# Patient Record
Sex: Male | Born: 1935 | Race: White | Hispanic: No | Smoking: Former smoker
Health system: Southern US, Community
[De-identification: ages and names within clinical notes are randomized; demographics above are authoritative.]

## PROBLEM LIST (undated history)

## (undated) DIAGNOSIS — D291 Benign neoplasm of prostate: Secondary | ICD-10-CM

## (undated) DIAGNOSIS — R451 Restlessness and agitation: Secondary | ICD-10-CM

## (undated) DIAGNOSIS — I10 Essential (primary) hypertension: Secondary | ICD-10-CM

## (undated) HISTORY — PX: CATARACT EXTRACTION: SUR2

---

## 2004-12-20 ENCOUNTER — Ambulatory Visit: Payer: Self-pay

## 2005-01-07 ENCOUNTER — Ambulatory Visit: Payer: Self-pay | Admitting: Anesthesiology

## 2005-01-11 ENCOUNTER — Ambulatory Visit: Payer: Self-pay | Admitting: Anesthesiology

## 2005-01-24 ENCOUNTER — Ambulatory Visit: Payer: Self-pay | Admitting: Anesthesiology

## 2005-02-07 ENCOUNTER — Ambulatory Visit: Payer: Self-pay | Admitting: Anesthesiology

## 2005-03-02 ENCOUNTER — Ambulatory Visit: Payer: Self-pay | Admitting: Unknown Physician Specialty

## 2005-04-06 ENCOUNTER — Ambulatory Visit: Payer: Self-pay | Admitting: Anesthesiology

## 2005-05-05 ENCOUNTER — Ambulatory Visit: Payer: Self-pay | Admitting: Anesthesiology

## 2005-07-13 ENCOUNTER — Ambulatory Visit: Payer: Self-pay | Admitting: Anesthesiology

## 2005-09-21 ENCOUNTER — Ambulatory Visit: Payer: Self-pay | Admitting: Anesthesiology

## 2005-10-24 ENCOUNTER — Ambulatory Visit: Payer: Self-pay | Admitting: Anesthesiology

## 2005-12-13 ENCOUNTER — Ambulatory Visit: Payer: Self-pay | Admitting: Anesthesiology

## 2006-01-18 ENCOUNTER — Ambulatory Visit: Payer: Self-pay | Admitting: Anesthesiology

## 2006-03-21 ENCOUNTER — Ambulatory Visit: Payer: Self-pay | Admitting: Anesthesiology

## 2006-05-17 ENCOUNTER — Ambulatory Visit: Payer: Self-pay | Admitting: Anesthesiology

## 2006-07-10 ENCOUNTER — Ambulatory Visit: Payer: Self-pay | Admitting: Anesthesiology

## 2006-09-05 ENCOUNTER — Ambulatory Visit: Payer: Self-pay | Admitting: Anesthesiology

## 2006-12-11 ENCOUNTER — Ambulatory Visit: Payer: Self-pay | Admitting: Anesthesiology

## 2007-02-06 ENCOUNTER — Ambulatory Visit: Payer: Self-pay | Admitting: Anesthesiology

## 2007-04-18 ENCOUNTER — Ambulatory Visit: Payer: Self-pay | Admitting: Anesthesiology

## 2007-06-18 ENCOUNTER — Ambulatory Visit: Payer: Self-pay | Admitting: Anesthesiology

## 2007-08-22 ENCOUNTER — Ambulatory Visit: Payer: Self-pay | Admitting: Anesthesiology

## 2007-10-11 ENCOUNTER — Ambulatory Visit: Payer: Self-pay | Admitting: Anesthesiology

## 2007-12-11 ENCOUNTER — Ambulatory Visit: Payer: Self-pay | Admitting: Anesthesiology

## 2008-01-17 ENCOUNTER — Ambulatory Visit: Payer: Self-pay | Admitting: Anesthesiology

## 2008-03-19 ENCOUNTER — Ambulatory Visit: Payer: Self-pay | Admitting: Anesthesiology

## 2008-05-13 ENCOUNTER — Ambulatory Visit: Payer: Self-pay | Admitting: Anesthesiology

## 2008-07-09 ENCOUNTER — Ambulatory Visit: Payer: Self-pay | Admitting: Anesthesiology

## 2008-09-18 ENCOUNTER — Ambulatory Visit: Payer: Self-pay | Admitting: Anesthesiology

## 2008-11-11 ENCOUNTER — Ambulatory Visit: Payer: Self-pay | Admitting: Anesthesiology

## 2009-01-07 ENCOUNTER — Ambulatory Visit: Payer: Self-pay | Admitting: Anesthesiology

## 2009-03-26 ENCOUNTER — Ambulatory Visit: Payer: Self-pay | Admitting: Anesthesiology

## 2009-05-14 ENCOUNTER — Ambulatory Visit: Payer: Self-pay | Admitting: Anesthesiology

## 2009-07-15 ENCOUNTER — Ambulatory Visit: Payer: Self-pay | Admitting: Anesthesiology

## 2009-09-09 ENCOUNTER — Ambulatory Visit: Payer: Self-pay | Admitting: Anesthesiology

## 2009-09-22 ENCOUNTER — Ambulatory Visit: Payer: Self-pay | Admitting: Anesthesiology

## 2009-11-12 ENCOUNTER — Ambulatory Visit: Payer: Self-pay | Admitting: Anesthesiology

## 2010-04-20 ENCOUNTER — Ambulatory Visit: Payer: Self-pay | Admitting: Anesthesiology

## 2010-07-06 ENCOUNTER — Ambulatory Visit: Payer: Self-pay | Admitting: Anesthesiology

## 2010-09-07 ENCOUNTER — Ambulatory Visit: Payer: Self-pay | Admitting: Anesthesiology

## 2010-12-15 ENCOUNTER — Ambulatory Visit: Payer: Self-pay | Admitting: Anesthesiology

## 2011-02-10 ENCOUNTER — Ambulatory Visit: Payer: Self-pay | Admitting: Anesthesiology

## 2011-03-16 ENCOUNTER — Ambulatory Visit: Payer: Self-pay | Admitting: Anesthesiology

## 2011-05-04 ENCOUNTER — Ambulatory Visit: Payer: Self-pay | Admitting: Pain Medicine

## 2011-05-05 ENCOUNTER — Ambulatory Visit: Payer: Self-pay | Admitting: Pain Medicine

## 2011-05-06 ENCOUNTER — Ambulatory Visit: Payer: Self-pay | Admitting: Pain Medicine

## 2011-05-10 ENCOUNTER — Ambulatory Visit: Payer: Self-pay | Admitting: Pain Medicine

## 2011-05-16 ENCOUNTER — Ambulatory Visit: Payer: Self-pay | Admitting: Pain Medicine

## 2011-05-19 ENCOUNTER — Ambulatory Visit: Payer: Self-pay | Admitting: Pain Medicine

## 2011-06-06 ENCOUNTER — Ambulatory Visit: Payer: Self-pay | Admitting: Pain Medicine

## 2011-08-11 ENCOUNTER — Ambulatory Visit: Payer: Self-pay | Admitting: Pain Medicine

## 2011-10-27 ENCOUNTER — Ambulatory Visit: Payer: Self-pay | Admitting: Pain Medicine

## 2011-11-09 ENCOUNTER — Ambulatory Visit: Payer: Self-pay | Admitting: Pain Medicine

## 2015-02-05 DIAGNOSIS — I10 Essential (primary) hypertension: Secondary | ICD-10-CM | POA: Diagnosis not present

## 2015-02-05 DIAGNOSIS — E038 Other specified hypothyroidism: Secondary | ICD-10-CM | POA: Diagnosis not present

## 2015-02-05 DIAGNOSIS — M1990 Unspecified osteoarthritis, unspecified site: Secondary | ICD-10-CM | POA: Diagnosis not present

## 2015-02-05 DIAGNOSIS — D510 Vitamin B12 deficiency anemia due to intrinsic factor deficiency: Secondary | ICD-10-CM | POA: Diagnosis not present

## 2015-07-03 DIAGNOSIS — H6122 Impacted cerumen, left ear: Secondary | ICD-10-CM | POA: Diagnosis not present

## 2015-08-10 DIAGNOSIS — D510 Vitamin B12 deficiency anemia due to intrinsic factor deficiency: Secondary | ICD-10-CM | POA: Diagnosis not present

## 2015-08-10 DIAGNOSIS — E038 Other specified hypothyroidism: Secondary | ICD-10-CM | POA: Diagnosis not present

## 2015-08-10 DIAGNOSIS — I10 Essential (primary) hypertension: Secondary | ICD-10-CM | POA: Diagnosis not present

## 2015-08-18 DIAGNOSIS — D510 Vitamin B12 deficiency anemia due to intrinsic factor deficiency: Secondary | ICD-10-CM | POA: Diagnosis not present

## 2015-08-18 DIAGNOSIS — N401 Enlarged prostate with lower urinary tract symptoms: Secondary | ICD-10-CM | POA: Diagnosis not present

## 2015-08-18 DIAGNOSIS — M1990 Unspecified osteoarthritis, unspecified site: Secondary | ICD-10-CM | POA: Diagnosis not present

## 2015-08-18 DIAGNOSIS — E039 Hypothyroidism, unspecified: Secondary | ICD-10-CM | POA: Diagnosis not present

## 2015-08-18 DIAGNOSIS — Z23 Encounter for immunization: Secondary | ICD-10-CM | POA: Diagnosis not present

## 2015-08-18 DIAGNOSIS — I10 Essential (primary) hypertension: Secondary | ICD-10-CM | POA: Diagnosis not present

## 2016-01-13 DIAGNOSIS — R509 Fever, unspecified: Secondary | ICD-10-CM | POA: Diagnosis not present

## 2016-04-14 ENCOUNTER — Emergency Department: Payer: Commercial Managed Care - HMO

## 2016-04-14 ENCOUNTER — Encounter: Payer: Self-pay | Admitting: Internal Medicine

## 2016-04-14 ENCOUNTER — Inpatient Hospital Stay
Admission: EM | Admit: 2016-04-14 | Discharge: 2016-04-18 | DRG: 085 | Disposition: A | Discharge status: Skilled Nursing Facility | Payer: Commercial Managed Care - HMO | Attending: Internal Medicine | Admitting: Internal Medicine

## 2016-04-14 DIAGNOSIS — Z66 Do not resuscitate: Secondary | ICD-10-CM | POA: Diagnosis present

## 2016-04-14 DIAGNOSIS — M6281 Muscle weakness (generalized): Secondary | ICD-10-CM | POA: Diagnosis not present

## 2016-04-14 DIAGNOSIS — M6282 Rhabdomyolysis: Secondary | ICD-10-CM | POA: Diagnosis present

## 2016-04-14 DIAGNOSIS — S065X0A Traumatic subdural hemorrhage without loss of consciousness, initial encounter: Principal | ICD-10-CM | POA: Diagnosis present

## 2016-04-14 DIAGNOSIS — S199XXA Unspecified injury of neck, initial encounter: Secondary | ICD-10-CM | POA: Diagnosis not present

## 2016-04-14 DIAGNOSIS — E876 Hypokalemia: Secondary | ICD-10-CM | POA: Diagnosis present

## 2016-04-14 DIAGNOSIS — W19XXXA Unspecified fall, initial encounter: Secondary | ICD-10-CM

## 2016-04-14 DIAGNOSIS — R339 Retention of urine, unspecified: Secondary | ICD-10-CM | POA: Diagnosis not present

## 2016-04-14 DIAGNOSIS — Z9181 History of falling: Secondary | ICD-10-CM

## 2016-04-14 DIAGNOSIS — I10 Essential (primary) hypertension: Secondary | ICD-10-CM | POA: Diagnosis not present

## 2016-04-14 DIAGNOSIS — S065X9A Traumatic subdural hemorrhage with loss of consciousness of unspecified duration, initial encounter: Secondary | ICD-10-CM

## 2016-04-14 DIAGNOSIS — R6889 Other general symptoms and signs: Secondary | ICD-10-CM | POA: Diagnosis not present

## 2016-04-14 DIAGNOSIS — Z8249 Family history of ischemic heart disease and other diseases of the circulatory system: Secondary | ICD-10-CM

## 2016-04-14 DIAGNOSIS — E43 Unspecified severe protein-calorie malnutrition: Secondary | ICD-10-CM | POA: Diagnosis not present

## 2016-04-14 DIAGNOSIS — F0390 Unspecified dementia without behavioral disturbance: Secondary | ICD-10-CM | POA: Diagnosis present

## 2016-04-14 DIAGNOSIS — Z7401 Bed confinement status: Secondary | ICD-10-CM | POA: Diagnosis not present

## 2016-04-14 DIAGNOSIS — Z6823 Body mass index (BMI) 23.0-23.9, adult: Secondary | ICD-10-CM | POA: Diagnosis not present

## 2016-04-14 DIAGNOSIS — Y92003 Bedroom of unspecified non-institutional (private) residence as the place of occurrence of the external cause: Secondary | ICD-10-CM

## 2016-04-14 DIAGNOSIS — S065XAA Traumatic subdural hemorrhage with loss of consciousness status unknown, initial encounter: Secondary | ICD-10-CM | POA: Diagnosis present

## 2016-04-14 DIAGNOSIS — R911 Solitary pulmonary nodule: Secondary | ICD-10-CM | POA: Diagnosis present

## 2016-04-14 DIAGNOSIS — R9431 Abnormal electrocardiogram [ECG] [EKG]: Secondary | ICD-10-CM | POA: Diagnosis present

## 2016-04-14 DIAGNOSIS — I6200 Nontraumatic subdural hemorrhage, unspecified: Secondary | ICD-10-CM | POA: Diagnosis not present

## 2016-04-14 DIAGNOSIS — N189 Chronic kidney disease, unspecified: Secondary | ICD-10-CM | POA: Diagnosis present

## 2016-04-14 DIAGNOSIS — W06XXXA Fall from bed, initial encounter: Secondary | ICD-10-CM | POA: Diagnosis present

## 2016-04-14 DIAGNOSIS — I129 Hypertensive chronic kidney disease with stage 1 through stage 4 chronic kidney disease, or unspecified chronic kidney disease: Secondary | ICD-10-CM | POA: Diagnosis not present

## 2016-04-14 DIAGNOSIS — E441 Mild protein-calorie malnutrition: Secondary | ICD-10-CM | POA: Diagnosis not present

## 2016-04-14 DIAGNOSIS — D291 Benign neoplasm of prostate: Secondary | ICD-10-CM | POA: Diagnosis not present

## 2016-04-14 DIAGNOSIS — S61412A Laceration without foreign body of left hand, initial encounter: Secondary | ICD-10-CM | POA: Diagnosis present

## 2016-04-14 DIAGNOSIS — N401 Enlarged prostate with lower urinary tract symptoms: Secondary | ICD-10-CM | POA: Diagnosis present

## 2016-04-14 DIAGNOSIS — R338 Other retention of urine: Secondary | ICD-10-CM | POA: Diagnosis present

## 2016-04-14 DIAGNOSIS — Z5189 Encounter for other specified aftercare: Secondary | ICD-10-CM | POA: Diagnosis not present

## 2016-04-14 DIAGNOSIS — R451 Restlessness and agitation: Secondary | ICD-10-CM | POA: Diagnosis present

## 2016-04-14 DIAGNOSIS — R41841 Cognitive communication deficit: Secondary | ICD-10-CM | POA: Diagnosis not present

## 2016-04-14 DIAGNOSIS — R1312 Dysphagia, oropharyngeal phase: Secondary | ICD-10-CM | POA: Diagnosis not present

## 2016-04-14 HISTORY — DX: Essential (primary) hypertension: I10

## 2016-04-14 LAB — CBC
HCT: 39.7 % — ABNORMAL LOW (ref 40.0–52.0)
Hemoglobin: 13.2 g/dL (ref 13.0–18.0)
MCH: 29.6 pg (ref 26.0–34.0)
MCHC: 33.3 g/dL (ref 32.0–36.0)
MCV: 88.9 fL (ref 80.0–100.0)
Platelets: 140 10*3/uL — ABNORMAL LOW (ref 150–440)
RBC: 4.47 MIL/uL (ref 4.40–5.90)
RDW: 15 % — AB (ref 11.5–14.5)
WBC: 11.6 10*3/uL — ABNORMAL HIGH (ref 3.8–10.6)

## 2016-04-14 LAB — BASIC METABOLIC PANEL
ANION GAP: 7 (ref 5–15)
BUN: 16 mg/dL (ref 6–20)
CALCIUM: 8.8 mg/dL — AB (ref 8.9–10.3)
CO2: 26 mmol/L (ref 22–32)
Chloride: 105 mmol/L (ref 101–111)
Creatinine, Ser: 1.3 mg/dL — ABNORMAL HIGH (ref 0.61–1.24)
GFR calc Af Amer: 52 mL/min — ABNORMAL LOW (ref >60)
GFR, EST NON AFRICAN AMERICAN: 45 mL/min — AB (ref >60)
GLUCOSE: 138 mg/dL — AB (ref 65–99)
Potassium: 3.3 mmol/L — ABNORMAL LOW (ref 3.5–5.1)
Sodium: 138 mmol/L (ref 135–145)

## 2016-04-14 LAB — CK: CK TOTAL: 992 U/L — AB (ref 49–397)

## 2016-04-14 MED ORDER — SODIUM CHLORIDE 0.9 % IV SOLN
INTRAVENOUS | Status: DC
  Administered 2016-04-14: 17:00:00 via INTRAVENOUS

## 2016-04-14 MED ORDER — ONDANSETRON HCL 4 MG/2ML IJ SOLN: 4.0000 mg | Freq: Four times a day (QID) | INTRAMUSCULAR | Status: DC | PRN

## 2016-04-14 MED ORDER — DOCUSATE SODIUM 100 MG PO CAPS
100.0000 mg | ORAL_CAPSULE | Freq: Two times a day (BID) | ORAL | Status: DC
  Administered 2016-04-14 – 2016-04-17 (×6): 100 mg via ORAL
  Filled 2016-04-14 (×6): qty 1

## 2016-04-14 MED ORDER — SODIUM CHLORIDE 0.9 % IV SOLN: INTRAVENOUS | Status: DC

## 2016-04-14 MED ORDER — MORPHINE SULFATE (PF) 2 MG/ML IV SOLN: 1.0000 mg | INTRAVENOUS | Status: DC | PRN

## 2016-04-14 MED ORDER — ONDANSETRON HCL 4 MG PO TABS: 4.0000 mg | ORAL_TABLET | Freq: Four times a day (QID) | ORAL | Status: DC | PRN

## 2016-04-14 MED ORDER — SENNOSIDES-DOCUSATE SODIUM 8.6-50 MG PO TABS
1.0000 | ORAL_TABLET | Freq: Every evening | ORAL | Status: DC | PRN
  Administered 2016-04-16: 1 via ORAL
  Filled 2016-04-14: qty 1

## 2016-04-14 MED ORDER — SODIUM CHLORIDE 0.9 % IV BOLUS (SEPSIS)
500.0000 mL | Freq: Once | INTRAVENOUS | Status: AC
  Administered 2016-04-14: 500 mL via INTRAVENOUS

## 2016-04-14 MED ORDER — ACETAMINOPHEN 325 MG PO TABS: 650.0000 mg | ORAL_TABLET | Freq: Four times a day (QID) | ORAL | Status: DC | PRN

## 2016-04-14 MED ORDER — OXYCODONE HCL 5 MG PO TABS
5.0000 mg | ORAL_TABLET | ORAL | Status: DC | PRN
  Administered 2016-04-14: 17:00:00 5 mg via ORAL
  Filled 2016-04-14: qty 1

## 2016-04-14 MED ORDER — ACETAMINOPHEN 650 MG RE SUPP: 650.0000 mg | Freq: Four times a day (QID) | RECTAL | Status: DC | PRN

## 2016-04-14 NOTE — ED Notes (Signed)
EDP at bedside  

## 2016-04-14 NOTE — ED Notes (Signed)
Attempted to call report; floor unaware as to which nurse will be taking patient at this time and will have to call me back.  Name and ascom number left with Network engineer.

## 2016-04-14 NOTE — ED Notes (Signed)
Pt here after falling out of bed at home. Daughter wants patient evaluated. Pt denies any complaints at this time, does not seem to be good historian. Pt with redness to left eye, redness to left side of chest and left arm.

## 2016-04-14 NOTE — H&P (Addendum)
[facility] [place] at [facility]   PATIENT NAME: Aaron Ellis    MR#:  [id number]  DATE OF BIRTH:  [birth year]  DATE OF ADMISSION:  04/14/2016  PRIMARY CARE PHYSICIAN: Lauro Regulus., MD   REQUESTING/REFERRING PHYSICIAN: Dr Derrill Kay  CHIEF COMPLAINT:  Came in after a fall at home last night. Patient was found half slid under the bed by his daughter  HISTORY OF PRESENT ILLNESS:  Aaron Ellis  is a 82 y.o. male with a known history of Hypertension, BPH, chronic kidney disease, early cognitive decline comes in the emergency room brought in by family after he was found down on on that ground near his bed last night. Patient does not remember the fall. He appears somewhat confused. Daughter states he is getting on and off confusion along with frequent falls at home. Patient lives at home by himself uses a cane to get around. Daughter lives next door and checks on him several times a day. -CT scan in the emergency room showed subdural hematoma. Family does not want any aggressive management. They do not want him to get transferred. Patient otherwise is neurologically intact. He is to be admitted for further evaluation management.  PAST MEDICAL HISTORY:   Past Medical History  Diagnosis Date  . Hypertension     PAST SURGICAL HISTOIRY:   Past Surgical History  Procedure Laterality Date  . Cataract extraction      SOCIAL HISTORY:   Social History  Substance Use Topics  . Smoking status: Not on file  . Smokeless tobacco: Not on file  . Alcohol Use: Not on file    FAMILY HISTORY:   Family History  Problem Relation Age of Onset  . Hypertension Other     DRUG ALLERGIES:  No Known Allergies  REVIEW OF SYSTEMS:  Review of Systems  Unable to perform ROS: dementia     MEDICATIONS AT HOME:   Prior to Admission medications   Not on File      VITAL SIGNS:  Blood pressure 144/81, pulse 95, temperature 97.8 F (36.6 C), temperature source  Oral, resp. rate 12, SpO2 96 %.  PHYSICAL EXAMINATION:  GENERAL:  82 y.o.-year-old patient lying in the bed with no acute distress.  EYES: Pupils equal, round, reactive to light and accommodation. No scleral icterus. Extraocular muscles intact.  HEENT: Head atraumatic, normocephalic. Oropharynx and nasopharynx clear.  NECK:  Supple, no jugular venous distention. No thyroid enlargement, no tenderness.  LUNGS: Normal breath sounds bilaterally, no wheezing, rales,rhonchi or crepitation. No use of accessory muscles of respiration.  CARDIOVASCULAR: S1, S2 normal. No murmurs, rubs, or gallops.  ABDOMEN: Soft, nontender, nondistended. Bowel sounds present. No organomegaly or mass.  EXTREMITIES: No pedal edema, cyanosis, or clubbing. Small skin tear over the right wrist. No bleeding. Patient has different level of ecchymosis all over his upper and lower extremities NEUROLOGIC: Cranial nerves II through XII are intact. Muscle strength 4/5 in all extremities. Sensation intact. Gait not checked. Grossly nonfocal. PSYCHIATRIC: The patient is alert and oriented x 3.  SKIN: No obvious rash, lesion, or ulcer. Ecchymosis as described above.  LABORATORY PANEL:   CBC  Recent Labs Lab 04/14/16 1046  WBC 11.6*  HGB 13.2  HCT 39.7*  PLT 140*   ------------------------------------------------------------------------------------------------------------------  Chemistries   Recent Labs Lab 04/14/16 1046  NA 138  K 3.3*  CL 105  CO2 26  GLUCOSE 138*  BUN 16  CREATININE 1.30*  CALCIUM 8.8*   ------------------------------------------------------------------------------------------------------------------  Cardiac Enzymes  No results for input(s): TROPONINI in the last 168 hours. ------------------------------------------------------------------------------------------------------------------  RADIOLOGY:  Ct Head Wo Contrast  04/14/2016  ADDENDUM REPORT: 04/14/2016 12:03 ADDENDUM: Critical  Value/emergent results were called by telephone at the time of interpretation on 04/14/2016 at 1155 hours at to Dr. Minna Antis , who verbally acknowledged these results. Electronically Signed   By: Odessa Fleming M.D.   On: 04/14/2016 12:03  04/14/2016  CLINICAL DATA:  82 year old male who fell out of bed. Left orbital redness. Initial encounter. EXAM: CT HEAD WITHOUT CONTRAST CT CERVICAL SPINE WITHOUT CONTRAST TECHNIQUE: Multidetector CT imaging of the head and cervical spine was performed following the standard protocol without intravenous contrast. Multiplanar CT image reconstructions of the cervical spine were also generated. COMPARISON:  None. FINDINGS: CT HEAD FINDINGS No scalp hematoma identified. No acute orbits soft tissue findings. Negative visualized noncontrast deep soft tissue spaces of the face. Trace fluid level in the left sphenoid sinus. Trace ethmoid mucosal thickening. Other paranasal sinuses, mastoids, and tympanic cavities are clear. Calvarium intact. Asymmetric 5-6 mm thick extra-axial collection along the left hemisphere is low-density. The collection is slightly mixed density on series 5, image 17. The density is higher than CSF, although there is a mild volume of extra-axial CSF about both hemispheres. Trace rightward midline shift. No other intracranial hemorrhage identified. No ventriculomegaly. Patchy nonspecific cerebral white matter hypodensity. No definite cortical encephalomalacia. No cortically based acute infarct identified. CT CERVICAL SPINE FINDINGS Diffuse, bulky and bridging ventral vertebral endplate osteophytes from the C2 level to the upper thoracic spine (visible to T2-T3). There is also some bridging ossification of the posterior longitudinal ligament in the mid cervical spine. There is associated posterior element ankylosis at C2-C3 greater on the right. Bilateral posterior element alignment is maintained. Cervicothoracic junction alignment is within normal limits. No  spondylolisthesis identified. Visualized skull base is intact. No atlanto-occipital dissociation. Ligamentous hypertrophy about the odontoid. Occipital condyles -C1 mild to moderate degenerative changes. No cervical or upper thoracic vertebral fracture is identified. No epidural hemorrhage identified. Negative noncontrast paraspinal soft tissues. In the lung apices a solitary 5 mm lung nodule is noted on series 4 image 98 and this may be slightly calcified. IMPRESSION: 1. Positive for mild left subdural hematoma measuring 5-6 mm in thickness. Trace rightward midline shift. 2. No other No acute intracranial abnormality. 3. 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 No acute fracture or listhesis identified in the cervical spine. Ligamentous injury is not excluded. 4. Right upper lobe solitary 5 mm lung nodule. No follow-up needed if patient is low-risk. Non-contrast chest CT can be considered in 12 months if patient is high-risk. This recommendation follows the consensus statement: Guidelines for Management of Incidental Pulmonary Nodules Detected on CT Images:From the Fleischner Society 2017; published online before print (10.1148/[id number]). Electronically Signed: By: Odessa Fleming M.D. On: 04/14/2016 11:50   Ct Cervical Spine Wo Contrast  04/14/2016  ADDENDUM REPORT: 04/14/2016 12:03 ADDENDUM: Critical Value/emergent results were called by telephone at the time of interpretation on 04/14/2016 at 1155 hours at to Dr. Minna Antis , who verbally acknowledged these results. Electronically Signed   By: Odessa Fleming M.D.   On: 04/14/2016 12:03  04/14/2016  CLINICAL DATA:  82 year old male who fell out of bed. Left orbital redness. Initial encounter. EXAM: CT HEAD WITHOUT CONTRAST CT CERVICAL SPINE WITHOUT CONTRAST TECHNIQUE: Multidetector CT imaging of the head and cervical spine was performed following the standard protocol without intravenous contrast. Multiplanar CT image reconstructions  of the  cervical spine were also generated. COMPARISON:  None. FINDINGS: CT HEAD FINDINGS No scalp hematoma identified. No acute orbits soft tissue findings. Negative visualized noncontrast deep soft tissue spaces of the face. Trace fluid level in the left sphenoid sinus. Trace ethmoid mucosal thickening. Other paranasal sinuses, mastoids, and tympanic cavities are clear. Calvarium intact. Asymmetric 5-6 mm thick extra-axial collection along the left hemisphere is low-density. The collection is slightly mixed density on series 5, image 17. The density is higher than CSF, although there is a mild volume of extra-axial CSF about both hemispheres. Trace rightward midline shift. No other intracranial hemorrhage identified. No ventriculomegaly. Patchy nonspecific cerebral white matter hypodensity. No definite cortical encephalomalacia. No cortically based acute infarct identified. CT CERVICAL SPINE FINDINGS Diffuse, bulky and bridging ventral vertebral endplate osteophytes from the C2 level to the upper thoracic spine (visible to T2-T3). There is also some bridging ossification of the posterior longitudinal ligament in the mid cervical spine. There is associated posterior element ankylosis at C2-C3 greater on the right. Bilateral posterior element alignment is maintained. Cervicothoracic junction alignment is within normal limits. No spondylolisthesis identified. Visualized skull base is intact. No atlanto-occipital dissociation. Ligamentous hypertrophy about the odontoid. Occipital condyles -C1 mild to moderate degenerative changes. No cervical or upper thoracic vertebral fracture is identified. No epidural hemorrhage identified. Negative noncontrast paraspinal soft tissues. In the lung apices a solitary 5 mm lung nodule is noted on series 4 image 98 and this may be slightly calcified. IMPRESSION: 1. Positive for mild left subdural hematoma measuring 5-6 mm in thickness. Trace rightward midline shift. 2. No other No  acute intracranial abnormality. 3. 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 No acute fracture or listhesis identified in the cervical spine. Ligamentous injury is not excluded. 4. Right upper lobe solitary 5 mm lung nodule. No follow-up needed if patient is low-risk. Non-contrast chest CT can be considered in 12 months if patient is high-risk. This recommendation follows the consensus statement: Guidelines for Management of Incidental Pulmonary Nodules Detected on CT Images:From the Fleischner Society 2017; published online before print (10.1148/[id number]). Electronically Signed: By: Odessa Fleming M.D. On: 04/14/2016 11:50    EKG:    IMPRESSION AND PLAN:  Aaron Ellis  is a 82 y.o. male with a known history of Hypertension, BPH, chronic kidney disease, early cognitive decline comes in the emergency room brought in by family after he was found down on on that ground near his bed last night. Patient does not remember the fall.  1. Acute left subdural hematoma CT head shows Positive for mild left subdural hematoma measuring 5-6 mm in thickness. Trace rightward midline shift. Avoid blood thinners neurochecks q 4 hourly Repeat CT head in the morning. Neurology consult if needed. Patient's family does not want anything aggressive done. They do not want him to be transferred to tertiary care center in the event it is needed. CODE STATUS discussed with patient's daughter Dois Davenport patient is DO NOT RESUSCITATE.  2.HTN Resume home meds  3. Early cognitive decline/dementia/falls at home Pt lives alone, uses cane, having falls at home SW for d/c planning PT to see pt  4. DVT prophylaxis SCD/TEDs  5. Mild rhabdomyolysis IVF for now  Spoke with dter and brother  All the records are reviewed and case discussed with ED provider. Management plans discussed with the patient, family and they are in agreement.  CODE STATUS: DNR  TOTAL TIME TAKING CARE OF THIS PATIENT: 50  minutes.    Danella Philson M.D on 04/14/2016  at 12:50 PM  Between 7am to 6pm - Pager - [phone]  After 6pm go to [email or web address] - password [email or web address] [facility]  [place] [facility]  Office  [phone]  CC: Primary care physician; Lauro Regulus., MD

## 2016-04-14 NOTE — ED Provider Notes (Signed)
[facility] Emergency Department Provider Note   ____________________________________________  Time seen: ~1140  I have reviewed the triage vital signs and the nursing notes.   HISTORY  Chief Complaint Fall   History limited by: Not Limited, some history obtained from daughter   HPI Aaron Ellis is a 82 y.o. male who presents to the emergency department from home today. Daughter states that when she got to his house this morning he was lying slightly underneath his bed. Patient is unclear of how he fell or came to be under the bed.He did suffer a small skin tear to his left hand. He also has some swelling around his left eye. The patient denies any pain in his head. He denies any pain in his neck. Denies any pain in his upper or lower extremities. Denies any recent fevers. Daughter states that he has had a slow decline over the past couple of months. He is currently living by himself. They have discussed with the patient possibility of going to a nursing facility elevated patient has been hesitant. They have not discussed this with primary care.   Past Medical History  Diagnosis Date  . Hypertension     Past Surgical History  Procedure Laterality Date  . Cataract extraction      Current Outpatient Rx  Name  Route  Sig  Dispense  Refill  . finasteride (PROSCAR) 5 MG tablet   Oral   Take 5 mg by mouth daily.         Marland Kitchen NIFEdipine (PROCARDIA-XL/ADALAT-CC/NIFEDICAL-XL) 30 MG 24 hr tablet   Oral   Take 30 mg by mouth daily.         . tamsulosin (FLOMAX) 0.4 MG CAPS capsule   Oral   Take 0.4 mg by mouth daily.           Allergies Review of patient's allergies indicates no known allergies.  Family History  Problem Relation Age of Onset  . Hypertension Other     Social History Lives at home  Review of Systems  Constitutional: Negative for fever. Cardiovascular: Negative for chest pain. Respiratory: Negative for shortness of  breath. Gastrointestinal: Negative for abdominal pain, vomiting and diarrhea. Neurological: Negative for headaches, focal weakness or numbness.  10-point ROS otherwise negative.  ____________________________________________   PHYSICAL EXAM:  VITAL SIGNS: ED Triage Vitals  Enc Vitals Group     BP 04/14/16 1044 107/51 mmHg     Pulse Rate 04/14/16 1044 97     Resp 04/14/16 1044 20     Temp 04/14/16 1044 97.8 F (36.6 C)     Temp Source 04/14/16 1044 Oral     SpO2 04/14/16 1044 94 %   Constitutional: Awake and alert. Not completely oriented to events.  Eyes:  PERRL. Normal extraocular movements. Some swelling around the left eye ENT   Head: Normocephalic and atraumatic.   Nose: No congestion/rhinnorhea.   Mouth/Throat: Mucous membranes are moist.   Neck: No stridor. Hematological/Lymphatic/Immunilogical: No cervical lymphadenopathy. Cardiovascular: Normal rate, regular rhythm.  No murmurs, rubs, or gallops. Respiratory: Normal respiratory effort without tachypnea nor retractions. Breath sounds are clear and equal bilaterally. No wheezes/rales/rhonchi. Gastrointestinal: Soft and nontender. No distention.  Genitourinary: Deferred Musculoskeletal: Normal range of motion in all extremities. No joint effusions.  No lower extremity tenderness nor edema. Neurologic:  Normal speech and language. No gross focal neurologic deficits are appreciated.  Skin:  Skin is warm, dry and intact. No rash noted. Psychiatric: Mood and affect are normal. Speech  and behavior are normal. Patient exhibits appropriate insight and judgment.  ____________________________________________    LABS (pertinent positives/negatives)  Labs Reviewed  BASIC METABOLIC PANEL - Abnormal; Notable for the following:    Potassium 3.3 (*)    Glucose, Bld 138 (*)    Creatinine, Ser 1.30 (*)    Calcium 8.8 (*)    GFR calc non Af Amer 45 (*)    GFR calc Af Amer 52 (*)    All other components within normal  limits  CBC - Abnormal; Notable for the following:    WBC 11.6 (*)    HCT 39.7 (*)    RDW 15.0 (*)    Platelets 140 (*)    All other components within normal limits  CK - Abnormal; Notable for the following:    Total CK 992 (*)    All other components within normal limits  URINALYSIS COMPLETEWITH MICROSCOPIC ([facility] ONLY)     ____________________________________________   EKG  I, Nance Pear, attending physician, personally viewed and interpreted this EKG  EKG Time: 1043 Rate: 98 Rhythm: unclear rhythm, question a fib with PVCs Axis: normal Intervals: qtc 446 QRS: narrow, q waves V1 ST changes: no st elevation Impression: abnormal ekg   ____________________________________________    RADIOLOGY  CT head/ cervical spine IMPRESSION: 1. Positive for mild left subdural hematoma measuring 5-6 mm in thickness. Trace rightward midline shift. 2. No other No acute intracranial abnormality. 3. 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 No acute fracture or listhesis identified in the cervical spine. Ligamentous injury is not excluded. 4. Right upper lobe solitary 5 mm lung nodule. No follow-up needed if patient is low-risk. Non-contrast chest CT can be considered in 12 months if patient is high-risk.  ____________________________________________   PROCEDURES  Procedure(s) performed: None  Critical Care performed: No  ____________________________________________   INITIAL IMPRESSION / ASSESSMENT AND PLAN / ED COURSE  Pertinent labs & imaging results that were available during my care of the patient were reviewed by me and considered in my medical decision making (see chart for details).  Patient presented to the emergency department today after being found underneath his bed by family. Patient does have a small skin tear to the left arm. Does have some swelling about his left eye. CT head was concerning for a small left subdural hematoma.  I discussed this finding with the patient and his family. At this point they would not want to pursue any surgical intervention. Will plan on admission here.  ____________________________________________   FINAL CLINICAL IMPRESSION(S) / ED DIAGNOSES  Final diagnoses:  Fall, initial encounter  Subdural hematoma ([place])     Nance Pear, MD 04/14/16 1336

## 2016-04-15 ENCOUNTER — Inpatient Hospital Stay: Payer: Commercial Managed Care - HMO

## 2016-04-15 LAB — URINALYSIS COMPLETE WITH MICROSCOPIC (ARMC ONLY)
Bacteria, UA: NONE SEEN
Bilirubin Urine: NEGATIVE
Glucose, UA: NEGATIVE mg/dL
KETONES UR: NEGATIVE mg/dL
LEUKOCYTES UA: NEGATIVE
Nitrite: NEGATIVE
PROTEIN: NEGATIVE mg/dL
SPECIFIC GRAVITY, URINE: 1.012 (ref 1.005–1.030)
Squamous Epithelial / LPF: NONE SEEN
WBC, UA: NONE SEEN WBC/hpf (ref 0–5)
pH: 6 (ref 5.0–8.0)

## 2016-04-15 MED ORDER — ZIPRASIDONE HCL 20 MG PO CAPS
20.0000 mg | ORAL_CAPSULE | Freq: Once | ORAL | Status: AC
  Administered 2016-04-15: 15:00:00 20 mg via ORAL
  Filled 2016-04-15: qty 1

## 2016-04-15 MED ORDER — ENSURE ENLIVE PO LIQD
237.0000 mL | Freq: Two times a day (BID) | ORAL | Status: DC
  Administered 2016-04-15 – 2016-04-17 (×5): 237 mL via ORAL

## 2016-04-15 MED ORDER — TAMSULOSIN HCL 0.4 MG PO CAPS
0.8000 mg | ORAL_CAPSULE | Freq: Every day | ORAL | Status: DC
  Administered 2016-04-15 – 2016-04-18 (×4): 0.8 mg via ORAL
  Filled 2016-04-15 (×4): qty 2

## 2016-04-15 MED ORDER — NIFEDIPINE ER OSMOTIC RELEASE 30 MG PO TB24
30.0000 mg | ORAL_TABLET | Freq: Every day | ORAL | Status: DC
  Administered 2016-04-16 – 2016-04-17 (×2): 30 mg via ORAL
  Filled 2016-04-15 (×4): qty 1

## 2016-04-15 MED ORDER — POTASSIUM CHLORIDE CRYS ER 20 MEQ PO TBCR
40.0000 meq | EXTENDED_RELEASE_TABLET | Freq: Once | ORAL | Status: AC
  Administered 2016-04-15: 11:00:00 40 meq via ORAL
  Filled 2016-04-15: qty 2

## 2016-04-15 MED ORDER — FINASTERIDE 5 MG PO TABS
5.0000 mg | ORAL_TABLET | Freq: Every day | ORAL | Status: DC
  Administered 2016-04-15 – 2016-04-17 (×3): 5 mg via ORAL
  Filled 2016-04-15 (×4): qty 1

## 2016-04-15 MED ORDER — TAMSULOSIN HCL 0.4 MG PO CAPS: 0.4000 mg | ORAL_CAPSULE | Freq: Every day | ORAL | Status: DC

## 2016-04-15 MED ORDER — QUETIAPINE FUMARATE 25 MG PO TABS
25.0000 mg | ORAL_TABLET | Freq: Every day | ORAL | Status: DC
  Administered 2016-04-15 – 2016-04-17 (×3): 25 mg via ORAL
  Filled 2016-04-15 (×3): qty 1

## 2016-04-15 NOTE — Progress Notes (Signed)
PT Cancellation Note  Patient Details Name: Aaron Ellis MRN: [id number] DOB: September 13, 1919   Cancelled Treatment:    Reason Eval/Treat Not Completed: Medical issues which prohibited therapy. Chart reviewed and RN consulted. Spoke with patient and family. Per family pt with worsening cognition and worsening slurred speech compared to yesterday. Pt is very difficult for therapist to understand on this date. He follows commands appropriately 50% of the time. Family believes he has declined since yesterday. Repeat CT order is on file but per RN it may not occur until later this afternoon due to one CT machine being non-operational. Even though family not wishing for neurosurgical intervention, will hold PT evaluation at this time until head CT can confirm that subdural hematoma is not evolving. If pt with active bleed will need to stabilize before it would be advisable to perform physical therapy evaluation. Will attempt PT evaluation at later date/time as pt is appropriate  Phillips Grout PT, DPT   Huprich,Jason 04/15/2016, 11:55 AM

## 2016-04-15 NOTE — Clinical Social Work Note (Signed)
Clinical Social Work Assessment  Patient Details  Name: Aaron Ellis MRN: [id number] Date of Birth: Nov 28, 1919  Date of referral:  04/15/16               Reason for consult:  Facility Placement                Permission sought to share information with:  Family Supports Permission granted to share information::  Yes, Verbal Permission Granted  Name::     Daughter, [name]   Housing/Transportation Living arrangements for the past 2 months:  Single Family Home Source of Information:  Adult Children Patient Interpreter Needed:  None Criminal Activity/Legal Involvement Pertinent to Current Situation/Hospitalization:  No - Comment as needed Significant Relationships:  Adult Children Lives with:  Self Do you feel safe going back to the place where you live?  Yes Need for family participation in patient care:  Yes (Comment)  Care giving concerns:  Pt may need a higher of care  Social Worker assessment / plan:  CSW met with pt and daughter to address consult. CSW introduced herself and explained role of social work. CSW also the process of discharging to SNF with [facility] [place]. PT eval is pending, however pt will likely need STR. CSW will initiate SNF search and will follow up with bed offers. CSW also provided information on Medicaid. CSW will continue to follow.   Employment status:  Retired Nurse, adult PT Recommendations:  Not assessed at this time Information / Referral to community resources:  [place]  Patient/Family's Response to care:  Pt's wife was Patent attorney of CSW support.   Patient/Family's Understanding of and Emotional Response to Diagnosis, Current Treatment, and Prognosis:  Pt's daughter understands that pt will need STR and maybe LTC.   Emotional Assessment Appearance:  Appears stated age Attitude/Demeanor/Rapport:  Other (Appropriate) Affect (typically observed):  Calm Orientation:  Oriented to Self, Fluctuating  Orientation (Suspected and/or reported Sundowners) Alcohol / Substance use:  Never Used Psych involvement (Current and /or in the community):  No (Comment)  Discharge Needs  Concerns to be addressed:  Adjustment to Illness Readmission within the last 30 days:  No Current discharge risk:  Chronically ill Barriers to Discharge:  Continued Medical Work up   [employer], LCSW 04/15/2016, 4:16 PM

## 2016-04-15 NOTE — Progress Notes (Signed)
Patient unable to void. Bladder scan done and showed greater than 600 in the bladder. Patient ask to try and empty bladder was only able to void 50 with a slow stream. Dr. Leslye Peer notified orders for and IN and Out received.

## 2016-04-15 NOTE — Progress Notes (Addendum)
Patient with minimal output overnight. Pt bladder scanned with a result of > 650. Dr Marcille Blanco notified. Orders received to I&O cath. Patient on proscar at home, Dr Marcille Blanco to resume proscar. Cath completed with approx 700 cc out. UA sent. Dr Marcille Blanco also notified that pt had removed several PIV, orders received that pt can remain without access and MIVF dc'd.

## 2016-04-15 NOTE — Progress Notes (Signed)
Patient ID: Aaron Ellis, male   DOB: 10/25/1919, 82 y.o.   MRN: [id number] [facility] PROGRESS NOTE  Aaron Ellis [id number] DOB: 1919-09-07 DOA: 04/14/2016 PCP: Kirk Ruths., MD  HPI/Subjective: As per the daughter, the patient's mental status is not good. He's been agitated in stating that he wants to go home. The patient had a fall and was found to have a subdural hematoma with shift.  Objective: Filed Vitals:   04/15/16 0627 04/15/16 1409  BP: 150/51 139/82  Pulse: 101 93  Temp: 98.6 F (37 C) 98.4 F (36.9 C)  Resp: 20 19    Filed Weights   04/14/16 1726  Weight: 73.936 kg (163 lb)    ROS: Review of Systems  Constitutional: Negative for fever and chills.  Eyes: Negative for blurred vision.  Respiratory: Negative for cough and shortness of breath.   Cardiovascular: Negative for chest pain.  Gastrointestinal: Negative for nausea, vomiting, abdominal pain, diarrhea and constipation.  Genitourinary: Negative for dysuria.  Musculoskeletal: Negative for joint pain.  Neurological: Negative for dizziness and headaches.   Exam: Physical Exam  HENT:  Nose: No mucosal edema.  Mouth/Throat: No oropharyngeal exudate or posterior oropharyngeal edema.  Eyes: Conjunctivae, EOM and lids are normal. Pupils are equal, round, and reactive to light.  Neck: No JVD present. Carotid bruit is not present. No edema present. No thyroid mass and no thyromegaly present.  Cardiovascular: S1 normal and S2 normal.  Exam reveals no gallop.   Murmur heard.  Systolic murmur is present with a grade of 2/6  Pulses:      Dorsalis pedis pulses are 2+ on the right side, and 2+ on the left side.  Respiratory: No respiratory distress. He has no wheezes. He has no rhonchi. He has no rales.  GI: Soft. Bowel sounds are normal. There is no tenderness.  Musculoskeletal:       Right ankle: He exhibits swelling.       Left ankle: He exhibits swelling.  Lymphadenopathy:    He has no cervical  adenopathy.  Neurological: He is alert. No cranial nerve deficit.  Patient able to move all extremities without a problem  Skin: Skin is warm. No rash noted. Nails show no clubbing.  Psychiatric: He is agitated.    Data Reviewed: Basic Metabolic Panel:  Recent Labs Lab 04/14/16 1046  NA 138  K 3.3*  CL 105  CO2 26  GLUCOSE 138*  BUN 16  CREATININE 1.30*  CALCIUM 8.8*   CBC:  Recent Labs Lab 04/14/16 1046  WBC 11.6*  HGB 13.2  HCT 39.7*  MCV 88.9  PLT 140*   Cardiac Enzymes:  Recent Labs Lab 04/14/16 1046  CKTOTAL 992*     Studies: Ct Head Wo Contrast  04/15/2016  CLINICAL DATA:  Follow-up left subdural hematoma EXAM: CT HEAD WITHOUT CONTRAST TECHNIQUE: Contiguous axial images were obtained from the base of the skull through the vertex without intravenous contrast. COMPARISON:  04/14/2016. FINDINGS: No skull fracture. Paranasal sinuses and mastoid air cells are unremarkable. Stable cerebral atrophy. Stable periventricular and patchy subcortical chronic white matter disease. No acute cortical infarction. Again noted asymmetric left subdural hematoma measures 6 mm maximum thickness stable in size in appearance from prior exam. Stable mild mixed density attenuation within collection without evidence of hyperacute blood products or new hemorrhage. No intraventricular hemorrhage. Stable about 3 mm left to right midline shift. IMPRESSION: Again noted asymmetric left subdural hematoma measures 6 mm maximum thickness stable in size in appearance  from prior exam. Stable mild mixed density attenuation within collection without evidence of hyperacute blood products or new hemorrhage. No intraventricular hemorrhage. Stable about 3 mm left to right midline shift. Stable atrophy and chronic white matter disease. No evidence of intraparenchymal hemorrhage. Electronically Signed   By: Lahoma Crocker M.D.   On: 04/15/2016 12:50   Ct Head Wo Contrast  04/14/2016  ADDENDUM REPORT: 04/14/2016  12:03 ADDENDUM: Critical Value/emergent results were called by telephone at the time of interpretation on 04/14/2016 at 1155 hours at to Dr. Harvest Dark , who verbally acknowledged these results. Electronically Signed   By: Genevie Ann M.D.   On: 04/14/2016 12:03  04/14/2016  CLINICAL DATA:  82 year old male who fell out of bed. Left orbital redness. Initial encounter. EXAM: CT HEAD WITHOUT CONTRAST CT CERVICAL SPINE WITHOUT CONTRAST TECHNIQUE: Multidetector CT imaging of the head and cervical spine was performed following the standard protocol without intravenous contrast. Multiplanar CT image reconstructions of the cervical spine were also generated. COMPARISON:  None. FINDINGS: CT HEAD FINDINGS No scalp hematoma identified. No acute orbits soft tissue findings. Negative visualized noncontrast deep soft tissue spaces of the face. Trace fluid level in the left sphenoid sinus. Trace ethmoid mucosal thickening. Other paranasal sinuses, mastoids, and tympanic cavities are clear. Calvarium intact. Asymmetric 5-6 mm thick extra-axial collection along the left hemisphere is low-density. The collection is slightly mixed density on series 5, image 17. The density is higher than CSF, although there is a mild volume of extra-axial CSF about both hemispheres. Trace rightward midline shift. No other intracranial hemorrhage identified. No ventriculomegaly. Patchy nonspecific cerebral white matter hypodensity. No definite cortical encephalomalacia. No cortically based acute infarct identified. CT CERVICAL SPINE FINDINGS Diffuse, bulky and bridging ventral vertebral endplate osteophytes from the C2 level to the upper thoracic spine (visible to T2-T3). There is also some bridging ossification of the posterior longitudinal ligament in the mid cervical spine. There is associated posterior element ankylosis at C2-C3 greater on the right. Bilateral posterior element alignment is maintained. Cervicothoracic junction alignment is  within normal limits. No spondylolisthesis identified. Visualized skull base is intact. No atlanto-occipital dissociation. Ligamentous hypertrophy about the odontoid. Occipital condyles -C1 mild to moderate degenerative changes. No cervical or upper thoracic vertebral fracture is identified. No epidural hemorrhage identified. Negative noncontrast paraspinal soft tissues. In the lung apices a solitary 5 mm lung nodule is noted on series 4 image 98 and this may be slightly calcified. IMPRESSION: 1. Positive for mild left subdural hematoma measuring 5-6 mm in thickness. Trace rightward midline shift. 2. No other No acute intracranial abnormality. 3. 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 No acute fracture or listhesis identified in the cervical spine. Ligamentous injury is not excluded. 4. Right upper lobe solitary 5 mm lung nodule. No follow-up needed if patient is low-risk. Non-contrast chest CT can be considered in 12 months if patient is high-risk. This recommendation follows the consensus statement: Guidelines for Management of Incidental Pulmonary Nodules Detected on CT Images:From the Fleischner Society 2017; published online before print (10.1148/radiol.IJ:2314499). Electronically Signed: By: Genevie Ann M.D. On: 04/14/2016 11:50   Ct Cervical Spine Wo Contrast  04/14/2016  ADDENDUM REPORT: 04/14/2016 12:03 ADDENDUM: Critical Value/emergent results were called by telephone at the time of interpretation on 04/14/2016 at 1155 hours at to Dr. Harvest Dark , who verbally acknowledged these results. Electronically Signed   By: Genevie Ann M.D.   On: 04/14/2016 12:03  04/14/2016  CLINICAL DATA:  82 year old male who  fell out of bed. Left orbital redness. Initial encounter. EXAM: CT HEAD WITHOUT CONTRAST CT CERVICAL SPINE WITHOUT CONTRAST TECHNIQUE: Multidetector CT imaging of the head and cervical spine was performed following the standard protocol without intravenous contrast. Multiplanar  CT image reconstructions of the cervical spine were also generated. COMPARISON:  None. FINDINGS: CT HEAD FINDINGS No scalp hematoma identified. No acute orbits soft tissue findings. Negative visualized noncontrast deep soft tissue spaces of the face. Trace fluid level in the left sphenoid sinus. Trace ethmoid mucosal thickening. Other paranasal sinuses, mastoids, and tympanic cavities are clear. Calvarium intact. Asymmetric 5-6 mm thick extra-axial collection along the left hemisphere is low-density. The collection is slightly mixed density on series 5, image 17. The density is higher than CSF, although there is a mild volume of extra-axial CSF about both hemispheres. Trace rightward midline shift. No other intracranial hemorrhage identified. No ventriculomegaly. Patchy nonspecific cerebral white matter hypodensity. No definite cortical encephalomalacia. No cortically based acute infarct identified. CT CERVICAL SPINE FINDINGS Diffuse, bulky and bridging ventral vertebral endplate osteophytes from the C2 level to the upper thoracic spine (visible to T2-T3). There is also some bridging ossification of the posterior longitudinal ligament in the mid cervical spine. There is associated posterior element ankylosis at C2-C3 greater on the right. Bilateral posterior element alignment is maintained. Cervicothoracic junction alignment is within normal limits. No spondylolisthesis identified. Visualized skull base is intact. No atlanto-occipital dissociation. Ligamentous hypertrophy about the odontoid. Occipital condyles -C1 mild to moderate degenerative changes. No cervical or upper thoracic vertebral fracture is identified. No epidural hemorrhage identified. Negative noncontrast paraspinal soft tissues. In the lung apices a solitary 5 mm lung nodule is noted on series 4 image 98 and this may be slightly calcified. IMPRESSION: 1. Positive for mild left subdural hematoma measuring 5-6 mm in thickness. Trace rightward midline  shift. 2. No other No acute intracranial abnormality. 3. 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 No acute fracture or listhesis identified in the cervical spine. Ligamentous injury is not excluded. 4. Right upper lobe solitary 5 mm lung nodule. No follow-up needed if patient is low-risk. Non-contrast chest CT can be considered in 12 months if patient is high-risk. This recommendation follows the consensus statement: Guidelines for Management of Incidental Pulmonary Nodules Detected on CT Images:From the Fleischner Society 2017; published online before print (10.1148/radiol.SG:[id number]). Electronically Signed: By: Genevie Ann M.D. On: 04/14/2016 11:50    Scheduled Meds: . docusate sodium  100 mg Oral BID  . feeding supplement (ENSURE ENLIVE)  237 mL Oral BID BM  . finasteride  5 mg Oral Daily  . QUEtiapine  25 mg Oral QHS  . tamsulosin  0.8 mg Oral Daily    Assessment/Plan:  1. Large subdural hematoma with midline brain shift. Patient is a DO NOT RESUSCITATE and no aggressive management. Repeat CT scan is similar. Daughter interested in placement. Need physical therapy evaluation. 2. Agitation. I will give Seroquel 25 mg at night. I did give 1 dose of Geodon. 3. Urinary retention. The patient required in and out catheter 2 times already. I increase Flomax to 0.8 mg daily and continue finasteride. 4. Renal insufficiency unclear if acute or chronic recheck a BMP tomorrow. 5. Hypokalemia replace potassium. Recheck tomorrow.  Code Status:     Code Status Orders        Start     Ordered   04/14/16 [id number]  Do not attempt resuscitation (DNR)   Continuous    Question Answer Comment  In the  event of cardiac or respiratory ARREST Do not call a "code blue"   In the event of cardiac or respiratory ARREST Do not perform Intubation, CPR, defibrillation or ACLS   In the event of cardiac or respiratory ARREST Use medication by any route, position, wound care, and other measures to  relive pain and suffering. May use oxygen, suction and manual treatment of airway obstruction as needed for comfort.      04/14/16 1402    Code Status History    Date Active Date Inactive Code Status Order ID Comments User Context   This patient has a current code status but no historical code status.     Family Communication: Daughter at the bedside earlier today and again in the afternoon with a repeat CAT scan report. Disposition Plan: Likely will need placement.  Time spent: 66minutes  Loletha Grayer  [employer]

## 2016-04-15 NOTE — Progress Notes (Signed)
Patient agitation increased stating he has to urinate he has only had 100 out since previous in and out. Dr. Leslye Peer notified orders for in and out Q6H ordered only if patient is in distress or complaining of pain and inability to urinate. Will continue to monitor patient.

## 2016-04-15 NOTE — Progress Notes (Addendum)
Initial Nutrition Assessment  DOCUMENTATION CODES:   Severe malnutrition in context of chronic illness  INTERVENTION:   Cater to pt preferences, will recommend Dysphagia III diet order so that meats will come already chopped If pt at risk for aspiration, recommend SLP evaulation Will recommend Ensure Enlive po BID, each supplement provides 350 kcal and 20 grams of protein, and will also send Magic Cup BID   NUTRITION DIAGNOSIS:   Malnutrition related to chronic illness as evidenced by energy intake < or equal to 75% for > or equal to 1 month, severe depletion of body fat, severe depletion of muscle mass.  GOAL:   Patient will meet greater than or equal to 90% of their needs  MONITOR:   PO intake, Supplement acceptance, Skin, Weight trends, Labs, I & O's  REASON FOR ASSESSMENT:   Malnutrition Screening Tool    ASSESSMENT:   Pt admitted after being found down. Per CT in ED pt with subdural hematoma, per MD note pt's family do not wish for aggressvie treatment.  Past Medical History  Diagnosis Date  . Hypertension     Diet Order:  DIET DYS 3 Room service appropriate?: Yes; Fluid consistency:: Thin   Pt ate bites at best of lunch observed on visit. Per family, pt with poor po intake for 'a long time.' Pt family reports pt used to drink Ensure but doesn't drink that any more. Pt used to like ice cream but does not eat that much any more. Pt family reports all pt will eat is sweets. Pt family reports pt eats bites of meals, this and that but progressively it has been declining.  Medications: Colace Labs: K 3.3, Cre 1.30, Glucose 138  Gastrointestinal Profile: Last BM:  04/13/2016   Nutrition-Focused Physical Exam Findings: Nutrition-Focused physical exam completed. Findings are moderate-severe fat depletion, moderate-severe muscle depletion, and no edema.     Weight Change: Pt family reports 2 years ago pt weight of 220lbs. Pt family reports weight continues to  gradually decrease. Family reports pt weight of 170lbs recently at an outpatient MD office.    Skin:  Reviewed, no issues   Height:   Ht Readings from Last 1 Encounters:  04/14/16 5\' 10"  (1.778 m)    Weight:   Wt Readings from Last 1 Encounters:  04/14/16 163 lb (73.936 kg)     BMI:  Body mass index is 23.39 kg/(m^2).  Estimated Nutritional Needs:   Kcal:  1850-2220kcals  Protein:  81-96g protein  Fluid:  >1.8 L fluid  EDUCATION NEEDS:   Education needs no appropriate at this time   Dwyane Luo, RD, LDN Pager [phone] Weekend/On-Call Pager [phone]

## 2016-04-15 NOTE — Progress Notes (Signed)
Patient is increasingly agitated and attempting to get out of bed. Dr. Leslye Peer notified no orders received. Will monitor patient and get a low bed with fall mats and a safety sitter as needed.

## 2016-04-16 ENCOUNTER — Inpatient Hospital Stay: Payer: Commercial Managed Care - HMO

## 2016-04-16 DIAGNOSIS — E43 Unspecified severe protein-calorie malnutrition: Secondary | ICD-10-CM | POA: Insufficient documentation

## 2016-04-16 MED ORDER — QUETIAPINE FUMARATE 25 MG PO TABS: 25.0000 mg | ORAL_TABLET | Freq: Three times a day (TID) | ORAL | Status: DC | PRN

## 2016-04-16 MED ORDER — ZIPRASIDONE MESYLATE 20 MG IM SOLR
10.0000 mg | Freq: Four times a day (QID) | INTRAMUSCULAR | Status: DC | PRN
  Filled 2016-04-16: qty 20

## 2016-04-16 MED ORDER — HALOPERIDOL LACTATE 5 MG/ML IJ SOLN: 1.0000 mg | Freq: Once | INTRAMUSCULAR | Status: DC

## 2016-04-16 NOTE — Plan of Care (Signed)
Problem: Safety: Goal: Ability to remain free from injury will improve Outcome: Progressing 1:1 Safety Sitter at Bedside.

## 2016-04-16 NOTE — Progress Notes (Signed)
Patient ID: Aaron Ellis, male   DOB: 12-31-1918, 82 y.o.   MRN: [id number] [facility] PROGRESS NOTE  Aaron Ellis [id number] DOB: January 04, 1919 DOA: 04/14/2016 PCP: Kirk Ruths., MD  HPI/Subjective: Patient now sleeping. Had a rough day yesterday and last night. He did not move when I flash to light in his eyes.  Objective: Filed Vitals:   04/15/16 2125 04/16/16 0645  BP: 160/91 150/42  Pulse: 84 105  Temp: 97.8 F (36.6 C) 97.7 F (36.5 C)  Resp: 18 18    Filed Weights   04/14/16 1726  Weight: 73.936 kg (163 lb)    ROS: Review of Systems  Unable to perform ROS Secondary to altered mental status Exam: Physical Exam  Constitutional: He appears lethargic.  HENT:  Nose: No mucosal edema.  Mouth/Throat: No oropharyngeal exudate or posterior oropharyngeal edema.  Eyes: Conjunctivae and lids are normal.  Pupils pinpoint bilaterally  Neck: No JVD present. Carotid bruit is not present. No edema present. No thyroid mass and no thyromegaly present.  Cardiovascular: S1 normal and S2 normal.  Exam reveals no gallop.   Murmur heard.  Systolic murmur is present with a grade of 2/6  Pulses:      Dorsalis pedis pulses are 2+ on the right side, and 2+ on the left side.  Respiratory: No respiratory distress. He has no wheezes. He has no rhonchi. He has no rales.  GI: Soft. Bowel sounds are normal. There is no tenderness.  Musculoskeletal:       Right ankle: He exhibits swelling.       Left ankle: He exhibits swelling.  Lymphadenopathy:    He has no cervical adenopathy.  Neurological: He appears lethargic.  Skin: Skin is warm. No rash noted. Nails show no clubbing.  Psychiatric:  Patient now sedated    Data Reviewed: Basic Metabolic Panel:  Recent Labs Lab 04/14/16 1046  NA 138  K 3.3*  CL 105  CO2 26  GLUCOSE 138*  BUN 16  CREATININE 1.30*  CALCIUM 8.8*   CBC:  Recent Labs Lab 04/14/16 1046  WBC 11.6*  HGB 13.2  HCT 39.7*  MCV 88.9  PLT 140*    Cardiac Enzymes:  Recent Labs Lab 04/14/16 1046  CKTOTAL 992*     Studies: Ct Head Wo Contrast  04/15/2016  CLINICAL DATA:  Follow-up left subdural hematoma EXAM: CT HEAD WITHOUT CONTRAST TECHNIQUE: Contiguous axial images were obtained from the base of the skull through the vertex without intravenous contrast. COMPARISON:  04/14/2016. FINDINGS: No skull fracture. Paranasal sinuses and mastoid air cells are unremarkable. Stable cerebral atrophy. Stable periventricular and patchy subcortical chronic white matter disease. No acute cortical infarction. Again noted asymmetric left subdural hematoma measures 6 mm maximum thickness stable in size in appearance from prior exam. Stable mild mixed density attenuation within collection without evidence of hyperacute blood products or new hemorrhage. No intraventricular hemorrhage. Stable about 3 mm left to right midline shift. IMPRESSION: Again noted asymmetric left subdural hematoma measures 6 mm maximum thickness stable in size in appearance from prior exam. Stable mild mixed density attenuation within collection without evidence of hyperacute blood products or new hemorrhage. No intraventricular hemorrhage. Stable about 3 mm left to right midline shift. Stable atrophy and chronic white matter disease. No evidence of intraparenchymal hemorrhage. Electronically Signed   By: Lahoma Crocker M.D.   On: 04/15/2016 12:50   Ct Head Wo Contrast  04/14/2016  ADDENDUM REPORT: 04/14/2016 12:03 ADDENDUM: Critical Value/emergent results were called by  telephone at the time of interpretation on 04/14/2016 at 1155 hours at to Dr. Harvest Dark , who verbally acknowledged these results. Electronically Signed   By: Genevie Ann M.D.   On: 04/14/2016 12:03  04/14/2016  CLINICAL DATA:  82 year old male who fell out of bed. Left orbital redness. Initial encounter. EXAM: CT HEAD WITHOUT CONTRAST CT CERVICAL SPINE WITHOUT CONTRAST TECHNIQUE: Multidetector CT imaging of the head and  cervical spine was performed following the standard protocol without intravenous contrast. Multiplanar CT image reconstructions of the cervical spine were also generated. COMPARISON:  None. FINDINGS: CT HEAD FINDINGS No scalp hematoma identified. No acute orbits soft tissue findings. Negative visualized noncontrast deep soft tissue spaces of the face. Trace fluid level in the left sphenoid sinus. Trace ethmoid mucosal thickening. Other paranasal sinuses, mastoids, and tympanic cavities are clear. Calvarium intact. Asymmetric 5-6 mm thick extra-axial collection along the left hemisphere is low-density. The collection is slightly mixed density on series 5, image 17. The density is higher than CSF, although there is a mild volume of extra-axial CSF about both hemispheres. Trace rightward midline shift. No other intracranial hemorrhage identified. No ventriculomegaly. Patchy nonspecific cerebral white matter hypodensity. No definite cortical encephalomalacia. No cortically based acute infarct identified. CT CERVICAL SPINE FINDINGS Diffuse, bulky and bridging ventral vertebral endplate osteophytes from the C2 level to the upper thoracic spine (visible to T2-T3). There is also some bridging ossification of the posterior longitudinal ligament in the mid cervical spine. There is associated posterior element ankylosis at C2-C3 greater on the right. Bilateral posterior element alignment is maintained. Cervicothoracic junction alignment is within normal limits. No spondylolisthesis identified. Visualized skull base is intact. No atlanto-occipital dissociation. Ligamentous hypertrophy about the odontoid. Occipital condyles -C1 mild to moderate degenerative changes. No cervical or upper thoracic vertebral fracture is identified. No epidural hemorrhage identified. Negative noncontrast paraspinal soft tissues. In the lung apices a solitary 5 mm lung nodule is noted on series 4 image 98 and this may be slightly calcified.  IMPRESSION: 1. Positive for mild left subdural hematoma measuring 5-6 mm in thickness. Trace rightward midline shift. 2. No other No acute intracranial abnormality. 3. 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 No acute fracture or listhesis identified in the cervical spine. Ligamentous injury is not excluded. 4. Right upper lobe solitary 5 mm lung nodule. No follow-up needed if patient is low-risk. Non-contrast chest CT can be considered in 12 months if patient is high-risk. This recommendation follows the consensus statement: Guidelines for Management of Incidental Pulmonary Nodules Detected on CT Images:From the Fleischner Society 2017; published online before print (10.1148/radiol.IJ:2314499). Electronically Signed: By: Genevie Ann M.D. On: 04/14/2016 11:50   Ct Cervical Spine Wo Contrast  04/14/2016  ADDENDUM REPORT: 04/14/2016 12:03 ADDENDUM: Critical Value/emergent results were called by telephone at the time of interpretation on 04/14/2016 at 1155 hours at to Dr. Harvest Dark , who verbally acknowledged these results. Electronically Signed   By: Genevie Ann M.D.   On: 04/14/2016 12:03  04/14/2016  CLINICAL DATA:  82 year old male who fell out of bed. Left orbital redness. Initial encounter. EXAM: CT HEAD WITHOUT CONTRAST CT CERVICAL SPINE WITHOUT CONTRAST TECHNIQUE: Multidetector CT imaging of the head and cervical spine was performed following the standard protocol without intravenous contrast. Multiplanar CT image reconstructions of the cervical spine were also generated. COMPARISON:  None. FINDINGS: CT HEAD FINDINGS No scalp hematoma identified. No acute orbits soft tissue findings. Negative visualized noncontrast deep soft tissue spaces of the face. Trace  fluid level in the left sphenoid sinus. Trace ethmoid mucosal thickening. Other paranasal sinuses, mastoids, and tympanic cavities are clear. Calvarium intact. Asymmetric 5-6 mm thick extra-axial collection along the left  hemisphere is low-density. The collection is slightly mixed density on series 5, image 17. The density is higher than CSF, although there is a mild volume of extra-axial CSF about both hemispheres. Trace rightward midline shift. No other intracranial hemorrhage identified. No ventriculomegaly. Patchy nonspecific cerebral white matter hypodensity. No definite cortical encephalomalacia. No cortically based acute infarct identified. CT CERVICAL SPINE FINDINGS Diffuse, bulky and bridging ventral vertebral endplate osteophytes from the C2 level to the upper thoracic spine (visible to T2-T3). There is also some bridging ossification of the posterior longitudinal ligament in the mid cervical spine. There is associated posterior element ankylosis at C2-C3 greater on the right. Bilateral posterior element alignment is maintained. Cervicothoracic junction alignment is within normal limits. No spondylolisthesis identified. Visualized skull base is intact. No atlanto-occipital dissociation. Ligamentous hypertrophy about the odontoid. Occipital condyles -C1 mild to moderate degenerative changes. No cervical or upper thoracic vertebral fracture is identified. No epidural hemorrhage identified. Negative noncontrast paraspinal soft tissues. In the lung apices a solitary 5 mm lung nodule is noted on series 4 image 98 and this may be slightly calcified. IMPRESSION: 1. Positive for mild left subdural hematoma measuring 5-6 mm in thickness. Trace rightward midline shift. 2. No other No acute intracranial abnormality. 3. 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 No acute fracture or listhesis identified in the cervical spine. Ligamentous injury is not excluded. 4. Right upper lobe solitary 5 mm lung nodule. No follow-up needed if patient is low-risk. Non-contrast chest CT can be considered in 12 months if patient is high-risk. This recommendation follows the consensus statement: Guidelines for Management of  Incidental Pulmonary Nodules Detected on CT Images:From the Fleischner Society 2017; published online before print (10.1148/radiol.IJ:2314499). Electronically Signed: By: Genevie Ann M.D. On: 04/14/2016 11:50    Scheduled Meds: . docusate sodium  100 mg Oral BID  . feeding supplement (ENSURE ENLIVE)  237 mL Oral BID BM  . finasteride  5 mg Oral Daily  . NIFEdipine  30 mg Oral Daily  . QUEtiapine  25 mg Oral QHS  . tamsulosin  0.8 mg Oral Daily    Assessment/Plan:  1. Large subdural hematoma with midline brain shift. Patient is a DO NOT RESUSCITATE and no aggressive management. Repeat CT head tomorrow. Depending on how he does with his agitation and his eating and his activity will depend on where we will be able to discharge him to. Could be a potential for hospice home versus rehabilitation depending on clinical course. 2. Agitation. I will give Seroquel 25 mg at night. I ordered when necessary Seroquel every 8 hours for agitation orally. I ordered IM Geodon for severe agitation if unable to take oral medication. 3. Urinary retention. The patient required in and out catheterizations numerous times. I increased Flomax to 0.8 mg daily and continue finasteride. With his mental status and agitation I am hesitant on putting in a Foley catheter because he is high risk to pull it out. 4. Renal insufficiency unclear if acute or chronic. Likely will get worse if he is not eating. 5. Hypokalemia replaced. 6. Severe malnutrition. Independent risk factor for mortality. Terminates as  Code Status:     Code Status Orders        Start     Ordered   04/14/16 [id number]  Do not attempt resuscitation (  DNR)   Continuous    Question Answer Comment  In the event of cardiac or respiratory ARREST Do not call a "code blue"   In the event of cardiac or respiratory ARREST Do not perform Intubation, CPR, defibrillation or ACLS   In the event of cardiac or respiratory ARREST Use medication by any route, position, wound  care, and other measures to relive pain and suffering. May use oxygen, suction and manual treatment of airway obstruction as needed for comfort.      04/14/16 1402    Code Status History    Date Active Date Inactive Code Status Order ID Comments User Context   This patient has a current code status but no historical code status.     Family Communication: Daughter at the bedside, Disposition Plan: To be determined based on his clinical situation. Potential for rehabilitation if he does better. Potential for hospice home if he does worse.  Time spent: 14minutes  Loletha Grayer  [employer]

## 2016-04-16 NOTE — NC FL2 (Signed)
[place] LEVEL OF CARE SCREENING TOOL     IDENTIFICATION  Patient Name: Aaron Ellis Birthdate: 13-Dec-1918 Sex: male Admission Date (Current Location): 04/14/2016  [place] and [place] Number:  Engineering geologist and Address:  [facility], [address], [place], [place] [address]      Provider Number: [id number]  Attending Physician Name and Address:  Loletha Grayer, MD  Relative Name and Phone Number:       Current Level of Care: Hospital Recommended Level of Care: [place] Prior Approval Number:    Date Approved/Denied:   PASRR Number: [id number]  Discharge Plan: SNF    Current Diagnoses: Patient Active Problem List   Diagnosis Date Noted  . Protein-calorie malnutrition, severe 04/16/2016  . Subdural hematoma ([place]) 04/14/2016    Orientation RESPIRATION BLADDER Height & Weight     Self, Time, Situation, Place  Normal Incontinent Weight: 163 lb (73.936 kg) Height:  5\' 10"  (177.8 cm)  BEHAVIORAL SYMPTOMS/MOOD NEUROLOGICAL BOWEL NUTRITION STATUS      Incontinent Diet (Soft Diet)  AMBULATORY STATUS COMMUNICATION OF NEEDS Skin   Extensive Assist Verbally Normal                       Personal Care Assistance Level of Assistance  Bathing, Feeding, Dressing Bathing Assistance: Maximum assistance Feeding assistance: Limited assistance Dressing Assistance: Maximum assistance     Functional Limitations Info  Sight, Hearing, Speech Sight Info: Adequate Hearing Info: Impaired Speech Info: Adequate    SPECIAL CARE FACTORS FREQUENCY  PT (By licensed PT)     PT Frequency: 5              Contractures      Additional Factors Info  Code Status, Allergies Code Status Info: DNR Allergies Info: No known allergies           Current Medications (04/16/2016):  This is the current hospital active medication list Current Facility-Administered Medications  Medication Dose Route Frequency Provider  Last Rate Last Dose  . acetaminophen (TYLENOL) tablet 650 mg  650 mg Oral Q6H PRN Fritzi Mandes, MD       Or  . acetaminophen (TYLENOL) suppository 650 mg  650 mg Rectal Q6H PRN Fritzi Mandes, MD      . docusate sodium (COLACE) capsule 100 mg  100 mg Oral BID Fritzi Mandes, MD   100 mg at 04/15/16 2054  . feeding supplement (ENSURE ENLIVE) (ENSURE ENLIVE) liquid 237 mL  237 mL Oral BID BM Loletha Grayer, MD   237 mL at 04/15/16 1400  . finasteride (PROSCAR) tablet 5 mg  5 mg Oral Daily Harrie Foreman, MD   5 mg at 04/15/16 1119  . NIFEdipine (PROCARDIA-XL/ADALAT-CC/NIFEDICAL-XL) 24 hr tablet 30 mg  30 mg Oral Daily Loletha Grayer, MD   30 mg at 04/15/16 1615  . ondansetron (ZOFRAN) tablet 4 mg  4 mg Oral Q6H PRN Fritzi Mandes, MD       Or  . ondansetron (ZOFRAN) injection 4 mg  4 mg Intravenous Q6H PRN Fritzi Mandes, MD      . QUEtiapine (SEROQUEL) tablet 25 mg  25 mg Oral QHS Loletha Grayer, MD   25 mg at 04/15/16 2054  . QUEtiapine (SEROQUEL) tablet 25 mg  25 mg Oral Q8H PRN Loletha Grayer, MD      . senna-docusate (Senokot-S) tablet 1 tablet  1 tablet Oral QHS PRN Fritzi Mandes, MD      . tamsulosin (  FLOMAX) capsule 0.8 mg  0.8 mg Oral Daily Loletha Grayer, MD   0.8 mg at 04/15/16 1119  . ziprasidone (GEODON) injection 10 mg  10 mg Intramuscular Q6H PRN Loletha Grayer, MD         Discharge Medications: Please see discharge summary for a list of discharge medications.  Relevant Imaging Results:  Relevant Lab Results:   Additional Information SSN:  [id number]  Darden Dates, LCSW

## 2016-04-16 NOTE — Clinical Social Work Placement (Signed)
   CLINICAL SOCIAL WORK PLACEMENT  NOTE  Date:  04/16/2016  Patient Details  Name: Aaron Ellis MRN: [id number] Date of Birth: August 30, 1919  Clinical Social Work is seeking post-discharge placement for this patient at the [place] level of care (*CSW will initial, date and re-position this form in  chart as items are completed):  Yes   Patient/family provided with [place] Work Department's list of facilities offering this level of care within the geographic area requested by the patient (or if unable, by the patient's family).  Yes   Patient/family informed of their freedom to choose among providers that offer the needed level of care, that participate in Medicare, Medicaid or managed care program needed by the patient, have an available bed and are willing to accept the patient.  Yes   Patient/family informed of [place]'s ownership interest in [facility] and [facility], as well as of the fact that they are under no obligation to receive care at these facilities.  PASRR submitted to EDS on 04/15/16     PASRR number received on 04/15/16     Existing PASRR number confirmed on       FL2 transmitted to all facilities in geographic area requested by pt/family on 04/16/16     FL2 transmitted to all facilities within larger geographic area on       Patient informed that his/her managed care company has contracts with or will negotiate with certain facilities, including the following:            Patient/family informed of bed offers received.  Patient chooses bed at       Physician recommends and patient chooses bed at      Patient to be transferred to   on  .  Patient to be transferred to facility by       Patient family notified on   of transfer.  Name of family member notified:        PHYSICIAN       Additional Comment:    _______________________________________________ Darden Dates, LCSW 04/16/2016, 12:28 PM

## 2016-04-16 NOTE — Care Management Note (Signed)
Case Management Note  Patient Details  Name: Aaron Ellis MRN: [id number] Date of Birth: Feb 25, 1919  Subjective/Objective:        82yo Aaron Ellis lives alone but daughter lives next door. [name] out of bed at home and was diagnosed with a mild subdural hematoma in the ED. CSW is following for placement. Today he has a 1:1 sitter per agitation and fall risk.             Action/Plan:   Expected Discharge Date:                  Expected Discharge Plan:     In-House Referral:     Discharge planning Services     Post Acute Care Choice:    Choice offered to:     DME Arranged:    DME Agency:     HH Arranged:    [place] Agency:     Status of Service:     Medicare Important Message Given:    Date Medicare IM Given:    Medicare IM give by:    Date Additional Medicare IM Given:    Additional Medicare Important Message give by:     If discussed at [place] of Stay Meetings, dates discussed:    Additional Comments:  Tramaine Snell A, RN 04/16/2016, 3:50 PM

## 2016-04-16 NOTE — Progress Notes (Signed)
PT Cancellation Note  Patient Details Name: Aaron Ellis MRN: [id number] DOB: 1919-08-29   Cancelled Treatment:    Reason Eval/Treat Not Completed: Medical issues which prohibited therapy.  Awaiting second CT for clarifcation of injury.     Ramond Dial 04/16/2016, 9:47 AM   Mee Hives, PT MS Acute Rehab Dept. Number: [place] and [place]

## 2016-04-17 NOTE — Progress Notes (Signed)
Patient ID: Aaron Ellis, male   DOB: 08/14/1919, 82 y.o.   MRN: [id number] [facility] PROGRESS NOTE  GREGERY DEVINCENZI [id number] DOB: 1919-06-16 DOA: 04/14/2016 PCP: Kirk Ruths., MD  HPI/Subjective: Patient more awake but confused  Objective: Filed Vitals:   04/17/16 0559 04/17/16 0901  BP: 128/67 99/61  Pulse: 66 89  Temp: 98.3 F (36.8 C) 98.9 F (37.2 C)  Resp: 18 20    Filed Weights   04/14/16 1726  Weight: 73.936 kg (163 lb)    ROS: Review of Systems  Unable to perform ROS Secondary to altered mental status Exam: Physical Exam  Constitutional: Chronically ill-appearing  HENT:  Nose: No mucosal edema.  Mouth/Throat: No oropharyngeal exudate or posterior oropharyngeal edema.  Eyes: Conjunctivae and lids are normal.   pupils equally round reactive Neck: No JVD present. Carotid bruit is not present. No edema present. No thyroid mass and no thyromegaly present.  Cardiovascular: S1 normal and S2 normal.  Exam reveals no gallop.   Murmur heard.  Systolic murmur is present with a grade of 2/6  Pulses:      Dorsalis pedis pulses are 2+ on the right side, and 2+ on the left side.  Respiratory: No respiratory distress. He has no wheezes. He has no rhonchi. He has no rales.  GI: Soft. Bowel sounds are normal. There is no tenderness.  Musculoskeletal:       Right ankle: He exhibits swelling.       Left ankle: He exhibits swelling.  Lymphadenopathy:    He has no cervical adenopathy.  Neurological: He appears lethargic.  Skin: Skin is warm. No rash noted. Nails show no clubbing.  Psychiatric:  Patient now sedated    Data Reviewed: Basic Metabolic Panel:  Recent Labs Lab 04/14/16 1046  NA 138  K 3.3*  CL 105  CO2 26  GLUCOSE 138*  BUN 16  CREATININE 1.30*  CALCIUM 8.8*   CBC:  Recent Labs Lab 04/14/16 1046  WBC 11.6*  HGB 13.2  HCT 39.7*  MCV 88.9  PLT 140*   Cardiac Enzymes:  Recent Labs Lab 04/14/16 1046  CKTOTAL 992*      Studies: Ct Head Wo Contrast  04/16/2016  CLINICAL DATA:  Followup subdural hemorrhage. EXAM: CT HEAD WITHOUT CONTRAST TECHNIQUE: Contiguous axial images were obtained from the base of the skull through the vertex without intravenous contrast. COMPARISON:  04/15/2016 and 04/14/2016. FINDINGS: Relatively low attenuation extra-axial fluid collection on the left followup mild flattening of several gyri, is without significant change. There is no evidence of new intracranial hemorrhage. Mild mass effect with mild shift of midline structures to the right is stable. Ventricles are normal configuration. There is ventricular and sulcal enlargement reflecting age related volume loss. No hydrocephalus. There is no evidence of a recent ischemic infarct. No parenchymal masses. No extra-axial masses. IMPRESSION: No significant change in the previously described extra-axial low-density fluid collection on the left causing mild mass effect. No new abnormalities. Electronically Signed   By: Lajean Manes M.D.   On: 04/16/2016 12:20    Scheduled Meds: . docusate sodium  100 mg Oral BID  . feeding supplement (ENSURE ENLIVE)  237 mL Oral BID BM  . finasteride  5 mg Oral Daily  . NIFEdipine  30 mg Oral Daily  . QUEtiapine  25 mg Oral QHS  . tamsulosin  0.8 mg Oral Daily    Assessment/Plan:  1. Large subdural hematoma with midline brain shift.Repeat CT scan of the head shows  similar finding. Asked speech 2. Agitation. Continue Seroquel at nighttime and every 8 hours as needed improved. 3. Urinary retention. Continue Flomax 4. Renal insufficiency unclear if acute or chronic. Likely will get worse if he is not eating. 5. Hypokalemia replaced. 6.  Severe malnutrition.  Code Status:     Code Status Orders        Start     Ordered   04/14/16 [id number]  Do not attempt resuscitation (DNR)   Continuous    Question Answer Comment  In the event of cardiac or respiratory ARREST Do not call a "code blue"   In the  event of cardiac or respiratory ARREST Do not perform Intubation, CPR, defibrillation or ACLS   In the event of cardiac or respiratory ARREST Use medication by any route, position, wound care, and other measures to relive pain and suffering. May use oxygen, suction and manual treatment of airway obstruction as needed for comfort.      04/14/16 1402    Code Status History    Date Active Date Inactive Code Status Order ID Comments User Context   This patient has a current code status but no historical code status.     Family Communication: Daughter at the bedside, Disposition Plan: To be determined based on his clinical situation. Potential for rehabilitation if he does better. Potential for hospice home if he does worse.  Time spent: 56minutes  [place], [facility]

## 2016-04-17 NOTE — Evaluation (Signed)
Physical Therapy Evaluation Patient Details Name: Aaron Ellis MRN: [id number] DOB: 06/10/1919 Today's Date: 04/17/2016   History of Present Illness  82 yo male with SDH from a fall was admitted, has Hx of HTN, CKD, early cognitive decline, confusion and frequent falls.  Clinical Impression  Pt is getting up to walk with PT and has relatively low endurance with balance changes.  His plan is to get SNF admission arranged but definitely showing also medical improvement to justify this request.  He is motivated and has a stable presentation of SDH on CT yesterday.  Anticipate the work in PT to be gait and balance, strengthening and will transition to rehab when appropriately.    Follow Up Recommendations SNF    Equipment Recommendations  None recommended by PT    Recommendations for Other Services Rehab consult     Precautions / Restrictions Precautions Precautions: Fall (telemetry) Restrictions Weight Bearing Restrictions: No      Mobility  Bed Mobility Overal bed mobility: Needs Assistance Bed Mobility: Supine to Sit     Supine to sit: Min assist;Min guard     General bed mobility comments: reminders to sequence  Transfers Overall transfer level: Needs assistance Equipment used: 1 person hand held assist Transfers: Sit to/from [employer] Transfers Sit to Stand: Min assist;Min guard Stand pivot transfers: Min assist;Min guard       General transfer comment: cues for reaching back to sit  Ambulation/Gait Ambulation/Gait assistance: Min guard;Min assist Ambulation Distance (Feet): 35 Feet Assistive device: 1 person hand held assist Gait Pattern/deviations: Step-through pattern;Wide base of support;Drifts right/left;Shuffle Gait velocity: reduced Gait velocity interpretation: Below normal speed for age/gender General Gait Details: Pt is slow but following instructions with HHA, steadying him at times for LOB with turns or to get around obstacles.  Stairs             Wheelchair Mobility    Modified Rankin (Stroke Patients Only)       Balance Overall balance assessment: Needs assistance Sitting-balance support: Feet supported Sitting balance-Leahy Scale: Fair     Standing balance support: Single extremity supported Standing balance-Leahy Scale: Poor                               Pertinent Vitals/Pain Pain Assessment: No/denies pain    Home Living Family/patient expects to be discharged to:: Private residence Living Arrangements: Alone (daughter next door) Available Help at Discharge: Family;Available PRN/intermittently Type of Home: House       Home Layout: One level Home Equipment: Cane - single point      Prior Function Level of Independence: Independent with assistive device(s)               Hand Dominance        Extremity/Trunk Assessment   Upper Extremity Assessment: Overall WFL for tasks assessed           Lower Extremity Assessment: Generalized weakness      Cervical / Trunk Assessment: Kyphotic  Communication   Communication: HOH  Cognition Arousal/Alertness: Awake/alert Behavior During Therapy: WFL for tasks assessed/performed Overall Cognitive Status: History of cognitive impairments - at baseline       Memory: Decreased recall of precautions;Decreased short-term memory              General Comments General comments (skin integrity, edema, etc.): Pt was seen for mobility and demonstrated a much more stable medical presentation today than yesterday.  Has  a limited tolerance for endurance, balance issues.  Will still encourage SNF placemetn for safety and to recover PLOF    Exercises        Assessment/Plan    PT Assessment Patient needs continued PT services  PT Diagnosis Difficulty walking   PT Problem List Decreased strength;Decreased range of motion;Decreased activity tolerance;Decreased balance;Decreased mobility;Decreased coordination;Decreased  knowledge of use of DME;Decreased safety awareness;Cardiopulmonary status limiting activity  PT Treatment Interventions DME instruction;Gait training;Functional mobility training;Therapeutic activities;Therapeutic exercise;Balance training;Neuromuscular re-education;Patient/family education   PT Goals (Current goals can be found in the Care Plan section) Acute Rehab PT Goals Patient Stated Goal: to get home PT Goal Formulation: With patient Time For Goal Achievement: 05/01/16 Potential to Achieve Goals: Good    Frequency Min 2X/week   Barriers to discharge Decreased caregiver support family next door but home alone    Co-evaluation               End of Session Equipment Utilized During Treatment: Gait belt Activity Tolerance: Patient tolerated treatment well;Patient limited by fatigue Patient left: in chair;with call bell/phone within reach;with chair alarm set Nurse Communication: Mobility status         Time: [id number] PT Time Calculation (min) (ACUTE ONLY): 27 min   Charges:   PT Evaluation $PT Eval Low Complexity: 1 Procedure PT Treatments $Gait Training: 8-22 mins   PT G Codes:        Ramond Dial 04-28-2016, 1:16 PM    Mee Hives, PT MS Acute Rehab Dept. Number: [place] [facility] [place]

## 2016-04-17 NOTE — Plan of Care (Signed)
Problem: Safety: Goal: Ability to remain free from injury will improve Outcome: Progressing Bed & Chair Alarm Utilized. No Sitter Needed.

## 2016-04-18 DIAGNOSIS — R41841 Cognitive communication deficit: Secondary | ICD-10-CM | POA: Diagnosis not present

## 2016-04-18 DIAGNOSIS — N401 Enlarged prostate with lower urinary tract symptoms: Secondary | ICD-10-CM | POA: Diagnosis not present

## 2016-04-18 DIAGNOSIS — M6281 Muscle weakness (generalized): Secondary | ICD-10-CM | POA: Diagnosis not present

## 2016-04-18 DIAGNOSIS — S065X9A Traumatic subdural hemorrhage with loss of consciousness of unspecified duration, initial encounter: Secondary | ICD-10-CM | POA: Diagnosis not present

## 2016-04-18 DIAGNOSIS — F0390 Unspecified dementia without behavioral disturbance: Secondary | ICD-10-CM | POA: Diagnosis not present

## 2016-04-18 DIAGNOSIS — R1312 Dysphagia, oropharyngeal phase: Secondary | ICD-10-CM | POA: Diagnosis not present

## 2016-04-18 DIAGNOSIS — Z7401 Bed confinement status: Secondary | ICD-10-CM | POA: Diagnosis not present

## 2016-04-18 DIAGNOSIS — R451 Restlessness and agitation: Secondary | ICD-10-CM | POA: Diagnosis not present

## 2016-04-18 DIAGNOSIS — Z5189 Encounter for other specified aftercare: Secondary | ICD-10-CM | POA: Diagnosis not present

## 2016-04-18 DIAGNOSIS — D291 Benign neoplasm of prostate: Secondary | ICD-10-CM | POA: Diagnosis not present

## 2016-04-18 DIAGNOSIS — I10 Essential (primary) hypertension: Secondary | ICD-10-CM | POA: Diagnosis not present

## 2016-04-18 DIAGNOSIS — R6889 Other general symptoms and signs: Secondary | ICD-10-CM | POA: Diagnosis not present

## 2016-04-18 DIAGNOSIS — R339 Retention of urine, unspecified: Secondary | ICD-10-CM | POA: Diagnosis not present

## 2016-04-18 DIAGNOSIS — I6200 Nontraumatic subdural hemorrhage, unspecified: Secondary | ICD-10-CM | POA: Diagnosis not present

## 2016-04-18 DIAGNOSIS — E441 Mild protein-calorie malnutrition: Secondary | ICD-10-CM | POA: Diagnosis not present

## 2016-04-18 MED ORDER — ACETAMINOPHEN 325 MG PO TABS: 650.0000 mg | ORAL_TABLET | Freq: Four times a day (QID) | ORAL | Status: DC | PRN

## 2016-04-18 MED ORDER — QUETIAPINE FUMARATE 25 MG PO TABS: 25.0000 mg | ORAL_TABLET | Freq: Every day | ORAL | Status: DC

## 2016-04-18 MED ORDER — QUETIAPINE FUMARATE 25 MG PO TABS: 25.0000 mg | ORAL_TABLET | Freq: Three times a day (TID) | ORAL | Status: DC | PRN

## 2016-04-18 MED ORDER — ENSURE ENLIVE PO LIQD: 237.0000 mL | Freq: Two times a day (BID) | ORAL | Status: DC

## 2016-04-18 NOTE — Evaluation (Addendum)
Clinical/Bedside Swallow Evaluation Patient Details  Name: Aaron Ellis MRN: [id number] Date of Birth: 12-16-1918  Today's Date: 04/18/2016 Time: SLP Start Time (ACUTE ONLY): 0830 SLP Stop Time (ACUTE ONLY): 0930 SLP Time Calculation (min) (ACUTE ONLY): 60 min  Past Medical History:  Past Medical History  Diagnosis Date  . Hypertension    Past Surgical History:  Past Surgical History  Procedure Laterality Date  . Cataract extraction     HPI:  Aaron Ellis is a 82 y.o. male with a known history of Hypertension, BPH, chronic kidney disease, early cognitive decline comes in the emergency room brought in by family after he was found down on on that ground near his bed last night. Patient does not remember the fall. He appears somewhat confused. Daughter states that his cognition has steadily been declining over last 6 months with frequent falls at home. Patient lives at home by himself uses a cane to get around. Daughter lives next door and checks on him several times a day.  Daughter provides all of pt's needs (including medicine management, meals, and housekeeping). She reports that pt has experienced fairly rapid cognitive decline over last 6 months with inability to recall orientation information prior to recent fall. She reports that he ate "soft foods" prior to recent fall. Daughter also reports that pt's speech appears worse (intermittently) and he is perseverating on "going home." Pt currently on dysphagia 3 diet with thin liquids. Skilled ST evaluation required to determine safety of current diet and establish plan of care.    Assessment / Plan / Recommendation Clinical Impression  Pt demonstrates mild orophrayngeal dysphagia characterized by decreased lingual manipulation and mastication of regular texture bolus resulting in mild midline residue. Pt able to effectively tolerate trials of dysphagia 3 texutres and min oral residue which he cleared with sips of thin liquids. Pt consumed thin  liquids via cup and straw without overt s/s of aspiration. Pt's voice remained clear throughout. Trials of regular textures resulted in increased oral prep time and cough during AP transfer. Pt stated "that's too hard to chew." Pt's daughter reports that pt's swallow abilities decreased when he is "laying in bed." Education provided on upright positioning when consuming PO intake. Pt unable to retain and follow compensatory swallow strategies, therefore continuing dysphagia 3 diet with medicine whole in puree is recommended. Pt's speech was intelligible throughout session at the sentence level to simple conversation level. Daughter comments that his speech intermittently becomes unintelligible when he lays down in bed. Would recommend cognitive-linguistic follow up at next level of care once pt becomes more oriented to situation. Pt required extensive redirection to task secondary to his perseveration of "going home." Nursing to reconsult ST services if pt condition changes during hospitalization.      Aspiration Risk  Mild aspiration risk    Diet Recommendation Dysphagia 3 with thin liquids via cup or straw  Medication Administration: Whole meds with puree    Other  Recommendations Oral Care Recommendations: Oral care BID   Follow up Recommendations  Skilled Nursing facility           Prognosis Prognosis for Safe Diet Advancement: Fair Barriers to Reach Goals: Cognitive deficits      Swallow Study   General Date of Onset: 04/14/16 HPI: Aaron Ellis is a 82 y.o. male with a known history of Hypertension, BPH, chronic kidney disease, early cognitive decline comes in the emergency room brought in by family after he was found down on on that ground near  his bed last night. Patient does not remember the fall. He appears somewhat confused. Daughter states he is getting on and off confusion along with frequent falls at home. Patient lives at home by himself uses a cane to get around. Daughter lives  next door and checks on him several times a day.  Daughter provides all of pt's needs (including medicine management, meals, and housekeeping). She reports that pt has experienced fairly rapid cognitive decline over last 6 months with inability to recall orientation information prior to recent fall. She reports that he ate "soft foods" prior to recent fall and "spit out" any meats that were too difficult to chew at home. Daughter also reports that pt's speech appears worse (intermittently) and he is perseverating on "going home." Pt currently on dysphagia 3 diet with thin liquids. Skilled ST evaluation required to determine safety of current diet and establish plan of care.  Type of Study: Bedside Swallow Evaluation Previous Swallow Assessment: N/A Diet Prior to this Study: Dysphagia 3 (soft);Thin liquids Temperature Spikes Noted: No Respiratory Status: Room air History of Recent Intubation: No Behavior/Cognition: Alert;Confused;Agitated;Requires cueing;Doesn't follow directions (Pt required frequent redirection to task. ) Oral Cavity Assessment: Within Functional Limits Oral Care Completed by SLP: No Oral Cavity - Dentition: Edentulous (Pt occasionally wears dentures when eating but not always. ) Vision: Functional for self-feeding Self-Feeding Abilities: Able to feed self;Needs set up Patient Positioning: Upright in bed Baseline Vocal Quality: Normal Volitional Cough: Strong Volitional Swallow: Able to elicit    Oral/Motor/Sensory Function Overall Oral Motor/Sensory Function: Within functional limits   Ice Chips Ice chips: Within functional limits Presentation: Spoon (6 trials by SLP) Other Comments:  (Pt consumed without s/s of aspiration. )   Thin Liquid Thin Liquid: Within functional limits Presentation: Cup;Straw;Self Fed (12 oz. ) Other Comments:  (Pt was free of any overt s/s of aspiration. )    Nectar Thick Nectar Thick Liquid: Not tested   Honey Thick Honey Thick Liquid: Not  tested   Puree Puree: Within functional limits Presentation: Self Fed;Spoon (6 trials) Other Comments:  (Pt tolerated well. )   Solid   GO   Solid: Impaired Presentation: Spoon (SLP fed 6 trials of graham crackers in applesauce and one trials of dry graham cracker) Oral Phase Impairments: Reduced lingual movement/coordination;Impaired mastication Oral Phase Functional Implications: Impaired mastication;Oral residue;Prolonged oral transit        Aaron Ellis 04/18/2016,10:12 AM

## 2016-04-18 NOTE — Consult Note (Signed)
[facility] CM Inpatient Consult   04/18/2016  Aaron Ellis 1919/08/03 [id number]  Patient screened for potential [facility] Care Management services. Patient is eligible for [facility] Care Management Services. Electronic medical record reveals patient's discharge plan is SNF and there were no identifiable [facility] care management needs at this time. [facility] Care Management services not appropriate at this time. If patient's post hospital needs change please place a [facility] Care Management consult. For questions please contact:   Britian Jentz RN, BSN [facility] Liaison  [phone]) Business Mobile [phone]) Toll free office

## 2016-04-18 NOTE — Progress Notes (Signed)
Pt alert. Some  Periods of confusion and  Forgetfulness.  No resp distress.  Pt to go to rehab at [facility].  [facility] and gave [facility].  Pt to go to room 710.  Family in room.  [facility]

## 2016-04-18 NOTE — Discharge Summary (Signed)
Aaron Ellis, 82 y.o., DOB 06/07/1919, MRN [id number]. Admission date: 04/14/2016 Discharge Date 04/18/2016 Primary MD Kirk Ruths., MD Admitting Physician Fritzi Mandes, MD  Admission Diagnosis  Subdural hematoma [facility]) [I62.00] Fall, initial encounter [W19.XXXA]  Discharge Diagnosis   Active Problems:   Subdural hematoma ([place])   Protein-calorie malnutrition, severe   S/p fall   HTN        Hospital Course  Aaron Ellis is a 82 y.o. male with a known history of Hypertension, BPH, chronic kidney disease, early cognitive decline comes in the emergency room brought in by family after he was found down on on that ground near his bed. Patient was brought to the emergency room. And had a CT scan of the head which showed a large subdural hematoma with midline shift. Option of transferring patient where neurosurgery services were available were provided to the family they wanted him to stay here and conservative treatment. Patient's is currently stable. He is in need of rehabilitation. He will also have palliative care team follow him there if his condition worsens then he should be transferred position to comfort care and hospice.         Consults  None  Significant Tests:  See full reports for all details      Ct Head Wo Contrast  04/16/2016  CLINICAL DATA:  Followup subdural hemorrhage. EXAM: CT HEAD WITHOUT CONTRAST TECHNIQUE: Contiguous axial images were obtained from the base of the skull through the vertex without intravenous contrast. COMPARISON:  04/15/2016 and 04/14/2016. FINDINGS: Relatively low attenuation extra-axial fluid collection on the left followup mild flattening of several gyri, is without significant change. There is no evidence of new intracranial hemorrhage. Mild mass effect with mild shift of midline structures to the right is stable. Ventricles are normal configuration. There is ventricular and sulcal enlargement reflecting age related volume loss. No  hydrocephalus. There is no evidence of a recent ischemic infarct. No parenchymal masses. No extra-axial masses. IMPRESSION: No significant change in the previously described extra-axial low-density fluid collection on the left causing mild mass effect. No new abnormalities. Electronically Signed   By: Lajean Manes M.D.   On: 04/16/2016 12:20   Ct Head Wo Contrast  04/15/2016  CLINICAL DATA:  Follow-up left subdural hematoma EXAM: CT HEAD WITHOUT CONTRAST TECHNIQUE: Contiguous axial images were obtained from the base of the skull through the vertex without intravenous contrast. COMPARISON:  04/14/2016. FINDINGS: No skull fracture. Paranasal sinuses and mastoid air cells are unremarkable. Stable cerebral atrophy. Stable periventricular and patchy subcortical chronic white matter disease. No acute cortical infarction. Again noted asymmetric left subdural hematoma measures 6 mm maximum thickness stable in size in appearance from prior exam. Stable mild mixed density attenuation within collection without evidence of hyperacute blood products or new hemorrhage. No intraventricular hemorrhage. Stable about 3 mm left to right midline shift. IMPRESSION: Again noted asymmetric left subdural hematoma measures 6 mm maximum thickness stable in size in appearance from prior exam. Stable mild mixed density attenuation within collection without evidence of hyperacute blood products or new hemorrhage. No intraventricular hemorrhage. Stable about 3 mm left to right midline shift. Stable atrophy and chronic white matter disease. No evidence of intraparenchymal hemorrhage. Electronically Signed   By: Lahoma Crocker M.D.   On: 04/15/2016 12:50   Ct Head Wo Contrast  04/14/2016  ADDENDUM REPORT: 04/14/2016 12:03 ADDENDUM: Critical Value/emergent results were called by telephone at the time of interpretation on 04/14/2016 at 1155 hours at to Dr. Harvest Dark ,  who verbally acknowledged these results. Electronically Signed   By: Genevie Ann M.D.   On: 04/14/2016 12:03  04/14/2016  CLINICAL DATA:  82 year old male who fell out of bed. Left orbital redness. Initial encounter. EXAM: CT HEAD WITHOUT CONTRAST CT CERVICAL SPINE WITHOUT CONTRAST TECHNIQUE: Multidetector CT imaging of the head and cervical spine was performed following the standard protocol without intravenous contrast. Multiplanar CT image reconstructions of the cervical spine were also generated. COMPARISON:  None. FINDINGS: CT HEAD FINDINGS No scalp hematoma identified. No acute orbits soft tissue findings. Negative visualized noncontrast deep soft tissue spaces of the face. Trace fluid level in the left sphenoid sinus. Trace ethmoid mucosal thickening. Other paranasal sinuses, mastoids, and tympanic cavities are clear. Calvarium intact. Asymmetric 5-6 mm thick extra-axial collection along the left hemisphere is low-density. The collection is slightly mixed density on series 5, image 17. The density is higher than CSF, although there is a mild volume of extra-axial CSF about both hemispheres. Trace rightward midline shift. No other intracranial hemorrhage identified. No ventriculomegaly. Patchy nonspecific cerebral white matter hypodensity. No definite cortical encephalomalacia. No cortically based acute infarct identified. CT CERVICAL SPINE FINDINGS Diffuse, bulky and bridging ventral vertebral endplate osteophytes from the C2 level to the upper thoracic spine (visible to T2-T3). There is also some bridging ossification of the posterior longitudinal ligament in the mid cervical spine. There is associated posterior element ankylosis at C2-C3 greater on the right. Bilateral posterior element alignment is maintained. Cervicothoracic junction alignment is within normal limits. No spondylolisthesis identified. Visualized skull base is intact. No atlanto-occipital dissociation. Ligamentous hypertrophy about the odontoid. Occipital condyles -C1 mild to moderate degenerative changes. No  cervical or upper thoracic vertebral fracture is identified. No epidural hemorrhage identified. Negative noncontrast paraspinal soft tissues. In the lung apices a solitary 5 mm lung nodule is noted on series 4 image 98 and this may be slightly calcified. IMPRESSION: 1. Positive for mild left subdural hematoma measuring 5-6 mm in thickness. Trace rightward midline shift. 2. No other No acute intracranial abnormality. 3. 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 No acute fracture or listhesis identified in the cervical spine. Ligamentous injury is not excluded. 4. Right upper lobe solitary 5 mm lung nodule. No follow-up needed if patient is low-risk. Non-contrast chest CT can be considered in 12 months if patient is high-risk. This recommendation follows the consensus statement: Guidelines for Management of Incidental Pulmonary Nodules Detected on CT Images:From the Fleischner Society 2017; published online before print (10.1148/radiol.IJ:2314499). Electronically Signed: By: Genevie Ann M.D. On: 04/14/2016 11:50   Ct Cervical Spine Wo Contrast  04/14/2016  ADDENDUM REPORT: 04/14/2016 12:03 ADDENDUM: Critical Value/emergent results were called by telephone at the time of interpretation on 04/14/2016 at 1155 hours at to Dr. Harvest Dark , who verbally acknowledged these results. Electronically Signed   By: Genevie Ann M.D.   On: 04/14/2016 12:03  04/14/2016  CLINICAL DATA:  82 year old male who fell out of bed. Left orbital redness. Initial encounter. EXAM: CT HEAD WITHOUT CONTRAST CT CERVICAL SPINE WITHOUT CONTRAST TECHNIQUE: Multidetector CT imaging of the head and cervical spine was performed following the standard protocol without intravenous contrast. Multiplanar CT image reconstructions of the cervical spine were also generated. COMPARISON:  None. FINDINGS: CT HEAD FINDINGS No scalp hematoma identified. No acute orbits soft tissue findings. Negative visualized noncontrast deep soft tissue  spaces of the face. Trace fluid level in the left sphenoid sinus. Trace ethmoid mucosal thickening. Other paranasal sinuses, mastoids, and  tympanic cavities are clear. Calvarium intact. Asymmetric 5-6 mm thick extra-axial collection along the left hemisphere is low-density. The collection is slightly mixed density on series 5, image 17. The density is higher than CSF, although there is a mild volume of extra-axial CSF about both hemispheres. Trace rightward midline shift. No other intracranial hemorrhage identified. No ventriculomegaly. Patchy nonspecific cerebral white matter hypodensity. No definite cortical encephalomalacia. No cortically based acute infarct identified. CT CERVICAL SPINE FINDINGS Diffuse, bulky and bridging ventral vertebral endplate osteophytes from the C2 level to the upper thoracic spine (visible to T2-T3). There is also some bridging ossification of the posterior longitudinal ligament in the mid cervical spine. There is associated posterior element ankylosis at C2-C3 greater on the right. Bilateral posterior element alignment is maintained. Cervicothoracic junction alignment is within normal limits. No spondylolisthesis identified. Visualized skull base is intact. No atlanto-occipital dissociation. Ligamentous hypertrophy about the odontoid. Occipital condyles -C1 mild to moderate degenerative changes. No cervical or upper thoracic vertebral fracture is identified. No epidural hemorrhage identified. Negative noncontrast paraspinal soft tissues. In the lung apices a solitary 5 mm lung nodule is noted on series 4 image 98 and this may be slightly calcified. IMPRESSION: 1. Positive for mild left subdural hematoma measuring 5-6 mm in thickness. Trace rightward midline shift. 2. No other No acute intracranial abnormality. 3. 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 No acute fracture or listhesis identified in the cervical spine. Ligamentous injury is not excluded. 4. Right  upper lobe solitary 5 mm lung nodule. No follow-up needed if patient is low-risk. Non-contrast chest CT can be considered in 12 months if patient is high-risk. This recommendation follows the consensus statement: Guidelines for Management of Incidental Pulmonary Nodules Detected on CT Images:From the Fleischner Society 2017; published online before print (10.1148/radiol.IJ:2314499). Electronically Signed: By: Genevie Ann M.D. On: 04/14/2016 11:50       Today   Subjective:   Aaron Ellis  denies any complaints  Objective:   Blood pressure 139/93, pulse 100, temperature 97.9 F (36.6 C), temperature source Oral, resp. rate 20, height 5\' 10"  (1.778 m), weight 73.936 kg (163 lb), SpO2 100 %.  .  Intake/Output Summary (Last 24 hours) at 04/18/16 1304 Last data filed at 04/18/16 0802  Gross per 24 hour  Intake    240 ml  Output    250 ml  Net    -10 ml    Exam VITAL SIGNS: Blood pressure 139/93, pulse 100, temperature 97.9 F (36.6 C), temperature source Oral, resp. rate 20, height 5\' 10"  (1.778 m), weight 73.936 kg (163 lb), SpO2 100 %.  GENERAL:  82 y.o.-year-old patient lying in the bed with no acute distress.  EYES: Pupils equal, round, reactive to light and accommodation. No scleral icterus. Extraocular muscles intact.  HEENT: Head atraumatic, normocephalic. Oropharynx and nasopharynx clear.  NECK:  Supple, no jugular venous distention. No thyroid enlargement, no tenderness.  LUNGS: Normal breath sounds bilaterally, no wheezing, rales,rhonchi or crepitation. No use of accessory muscles of respiration.  CARDIOVASCULAR: S1, S2 normal. No murmurs, rubs, or gallops.  ABDOMEN: Soft, nontender, nondistended. Bowel sounds present. No organomegaly or mass.  EXTREMITIES: No pedal edema, cyanosis, or clubbing.  NEUROLOGIC: Cranial nerves II through XII are intact. Muscle strength 5/5 in all extremities. Sensation intact. Gait not checked.  PSYCHIATRIC: The patient is alert and oriented Person at  not time Skin: No obvious rash, lesion, or ulcer.   Data Review     CBC w Diff: Lab Results  Component Value Date   WBC 11.6* 04/14/2016   HGB 13.2 04/14/2016   HCT 39.7* 04/14/2016   PLT 140* 04/14/2016   CMP: Lab Results  Component Value Date   NA 138 04/14/2016   K 3.3* 04/14/2016   CL 105 04/14/2016   CO2 26 04/14/2016   BUN 16 04/14/2016   CREATININE 1.30* 04/14/2016  .  Micro Results No results found for this or any previous visit (from the past 240 hour(s)).      Code Status Orders        Start     Ordered   04/14/16 [id number]  Do not attempt resuscitation (DNR)   Continuous    Question Answer Comment  In the event of cardiac or respiratory ARREST Do not call a "code blue"   In the event of cardiac or respiratory ARREST Do not perform Intubation, CPR, defibrillation or ACLS   In the event of cardiac or respiratory ARREST Use medication by any route, position, wound care, and other measures to relive pain and suffering. May use oxygen, suction and manual treatment of airway obstruction as needed for comfort.      04/14/16 1402    Code Status History    Date Active Date Inactive Code Status Order ID Comments User Context   This patient has a current code status but no historical code status.            Discharge Medications     Medication List    TAKE these medications        acetaminophen 325 MG tablet  Commonly known as:  TYLENOL  Take 2 tablets (650 mg total) by mouth every 6 (six) hours as needed for mild pain (or Fever >/= 101).     feeding supplement (ENSURE ENLIVE) Liqd  Take 237 mLs by mouth 2 (two) times daily between meals.     finasteride 5 MG tablet  Commonly known as:  PROSCAR  Take 5 mg by mouth daily.     NIFEdipine 30 MG 24 hr tablet  Commonly known as:  PROCARDIA-XL/ADALAT-CC/NIFEDICAL-XL  Take 30 mg by mouth daily.     QUEtiapine 25 MG tablet  Commonly known as:  SEROQUEL  Take 1 tablet (25 mg total) by mouth every 8  (eight) hours as needed (agitation).     QUEtiapine 25 MG tablet  Commonly known as:  SEROQUEL  Take 1 tablet (25 mg total) by mouth at bedtime.     tamsulosin 0.4 MG Caps capsule  Commonly known as:  FLOMAX  Take 0.4 mg by mouth daily.           Total Time in preparing paper work, data evaluation and todays exam - 35 minutes  Dustin Flock M.D on 04/18/2016 at 1:04 [facility]  [facility]   Office  [phone]

## 2016-04-18 NOTE — Care Management Important Message (Signed)
Important Message  Patient Details  Name: Aaron Ellis MRN: [id number] Date of Birth: [birth year]   Medicare Important Message Given:  Yes    [name] 04/18/2016, 12:20 PM

## 2016-04-18 NOTE — Discharge Instructions (Signed)
°  DIET:  Regular diet  DISCHARGE CONDITION:  Stable  ACTIVITY:  Activity as tolerated  OXYGEN:  Home Oxygen: No.   Oxygen Delivery: room air  DISCHARGE LOCATION:  snf   ADDITIONAL DISCHARGE INSTRUCTION:pallative care nurse practice to follow   If you experience worsening of your admission symptoms, develop shortness of breath, life threatening emergency, suicidal or homicidal thoughts you must seek medical attention immediately by calling 911 or calling your MD immediately  if symptoms less severe.  You Must read complete instructions/literature along with all the possible adverse reactions/side effects for all the Medicines you take and that have been prescribed to you. Take any new Medicines after you have completely understood and accpet all the possible adverse reactions/side effects.   Please note  You were cared for by a hospitalist during your hospital stay. If you have any questions about your discharge medications or the care you received while you were in the hospital after you are discharged, you can call the unit and asked to speak with the hospitalist on call if the hospitalist that took care of you is not available. Once you are discharged, your primary care physician will handle any further medical issues. Please note that NO REFILLS for any discharge medications will be authorized once you are discharged, as it is imperative that you return to your primary care physician (or establish a relationship with a primary care physician if you do not have one) for your aftercare needs so that they can reassess your need for medications and monitor your lab values.

## 2016-04-19 NOTE — Clinical Social Work Note (Signed)
Pt is ready for discharge today. Pt's daughter chose [facility]. [employer] auth obtained. Facility ready to admit pt as they have received discharge information. Pt and daughter are aware and agreeable to discharge plan. RN called report and EMS provide transportation. CSW is signing off as no further needs identified.   Darden Dates, MSW, LCSW  Clinical Social Worker  [phone]

## 2016-04-19 NOTE — Clinical Social Work Placement (Signed)
   CLINICAL SOCIAL WORK PLACEMENT  NOTE  Date:  04/19/2016  Patient Details  Name: Aaron Ellis MRN: [id number] Date of Birth: 1919/08/12  Clinical Social Work is seeking post-discharge placement for this patient at the [place] level of care (*CSW will initial, date and re-position this form in  chart as items are completed):  Yes   Patient/family provided with [facility] Work Department's list of facilities offering this level of care within the geographic area requested by the patient (or if unable, by the patient's family).  Yes   Patient/family informed of their freedom to choose among providers that offer the needed level of care, that participate in Medicare, Medicaid or managed care program needed by the patient, have an available bed and are willing to accept the patient.  Yes   Patient/family informed of [facility]'s ownership interest in [facility] and [facility], as well as of the fact that they are under no obligation to receive care at these facilities.  PASRR submitted to [employer]DS on 04/15/16     PASRR number received on 04/15/16     Existing PASRR number confirmed on       FL2 transmitted to all facilities in geographic area requested by pt/family on 04/16/16     FL2 transmitted to all facilities within larger geographic area on       Patient informed that his/her managed care company has contracts with or will negotiate with certain facilities, including the following:        Yes   Patient/family informed of bed offers received.  Patient chooses bed at [facility]     Physician recommends and patient chooses bed at  [facility])    Patient to be transferred to [facility] [place] on 04/18/16.  Patient to be transferred to facility by [facility]     Patient family notified on 04/18/16 of transfer.  Name of family member notified:  [name], daughter     PHYSICIAN       Additional Comment:     _______________________________________________ Darden Dates, LCSW 04/19/2016, 3:28 PM

## 2016-04-20 DIAGNOSIS — N401 Enlarged prostate with lower urinary tract symptoms: Secondary | ICD-10-CM | POA: Diagnosis not present

## 2016-04-20 DIAGNOSIS — S065X9A Traumatic subdural hemorrhage with loss of consciousness of unspecified duration, initial encounter: Secondary | ICD-10-CM | POA: Diagnosis not present

## 2016-04-20 DIAGNOSIS — I10 Essential (primary) hypertension: Secondary | ICD-10-CM | POA: Diagnosis not present

## 2016-04-28 DIAGNOSIS — I10 Essential (primary) hypertension: Secondary | ICD-10-CM | POA: Diagnosis not present

## 2016-04-28 DIAGNOSIS — N401 Enlarged prostate with lower urinary tract symptoms: Secondary | ICD-10-CM | POA: Diagnosis not present

## 2016-04-28 DIAGNOSIS — F0390 Unspecified dementia without behavioral disturbance: Secondary | ICD-10-CM | POA: Diagnosis not present

## 2016-04-28 DIAGNOSIS — S065X9A Traumatic subdural hemorrhage with loss of consciousness of unspecified duration, initial encounter: Secondary | ICD-10-CM | POA: Diagnosis not present

## 2016-05-05 DIAGNOSIS — R5381 Other malaise: Secondary | ICD-10-CM | POA: Diagnosis not present

## 2016-05-05 DIAGNOSIS — R262 Difficulty in walking, not elsewhere classified: Secondary | ICD-10-CM | POA: Diagnosis not present

## 2016-05-05 DIAGNOSIS — Z9189 Other specified personal risk factors, not elsewhere classified: Secondary | ICD-10-CM | POA: Diagnosis not present

## 2016-05-05 DIAGNOSIS — N402 Nodular prostate without lower urinary tract symptoms: Secondary | ICD-10-CM | POA: Diagnosis not present

## 2016-05-05 DIAGNOSIS — I10 Essential (primary) hypertension: Secondary | ICD-10-CM | POA: Diagnosis not present

## 2016-05-05 DIAGNOSIS — F419 Anxiety disorder, unspecified: Secondary | ICD-10-CM | POA: Diagnosis not present

## 2016-05-05 DIAGNOSIS — R634 Abnormal weight loss: Secondary | ICD-10-CM | POA: Diagnosis not present

## 2016-05-10 DIAGNOSIS — I129 Hypertensive chronic kidney disease with stage 1 through stage 4 chronic kidney disease, or unspecified chronic kidney disease: Secondary | ICD-10-CM | POA: Diagnosis not present

## 2016-05-10 DIAGNOSIS — S065X9D Traumatic subdural hemorrhage with loss of consciousness of unspecified duration, subsequent encounter: Secondary | ICD-10-CM | POA: Diagnosis not present

## 2016-05-10 DIAGNOSIS — G2581 Restless legs syndrome: Secondary | ICD-10-CM | POA: Diagnosis not present

## 2016-05-10 DIAGNOSIS — N189 Chronic kidney disease, unspecified: Secondary | ICD-10-CM | POA: Diagnosis not present

## 2016-05-10 DIAGNOSIS — F0390 Unspecified dementia without behavioral disturbance: Secondary | ICD-10-CM | POA: Diagnosis not present

## 2016-05-10 DIAGNOSIS — F419 Anxiety disorder, unspecified: Secondary | ICD-10-CM | POA: Diagnosis not present

## 2016-05-11 DIAGNOSIS — N189 Chronic kidney disease, unspecified: Secondary | ICD-10-CM | POA: Diagnosis not present

## 2016-05-11 DIAGNOSIS — F0390 Unspecified dementia without behavioral disturbance: Secondary | ICD-10-CM | POA: Diagnosis not present

## 2016-05-11 DIAGNOSIS — F419 Anxiety disorder, unspecified: Secondary | ICD-10-CM | POA: Diagnosis not present

## 2016-05-11 DIAGNOSIS — S065X9D Traumatic subdural hemorrhage with loss of consciousness of unspecified duration, subsequent encounter: Secondary | ICD-10-CM | POA: Diagnosis not present

## 2016-05-11 DIAGNOSIS — I129 Hypertensive chronic kidney disease with stage 1 through stage 4 chronic kidney disease, or unspecified chronic kidney disease: Secondary | ICD-10-CM | POA: Diagnosis not present

## 2016-05-11 DIAGNOSIS — G2581 Restless legs syndrome: Secondary | ICD-10-CM | POA: Diagnosis not present

## 2016-05-12 DIAGNOSIS — F0390 Unspecified dementia without behavioral disturbance: Secondary | ICD-10-CM | POA: Diagnosis not present

## 2016-05-12 DIAGNOSIS — N189 Chronic kidney disease, unspecified: Secondary | ICD-10-CM | POA: Diagnosis not present

## 2016-05-12 DIAGNOSIS — S065X9D Traumatic subdural hemorrhage with loss of consciousness of unspecified duration, subsequent encounter: Secondary | ICD-10-CM | POA: Diagnosis not present

## 2016-05-12 DIAGNOSIS — I129 Hypertensive chronic kidney disease with stage 1 through stage 4 chronic kidney disease, or unspecified chronic kidney disease: Secondary | ICD-10-CM | POA: Diagnosis not present

## 2016-05-12 DIAGNOSIS — F419 Anxiety disorder, unspecified: Secondary | ICD-10-CM | POA: Diagnosis not present

## 2016-05-12 DIAGNOSIS — G2581 Restless legs syndrome: Secondary | ICD-10-CM | POA: Diagnosis not present

## 2016-05-17 DIAGNOSIS — F0390 Unspecified dementia without behavioral disturbance: Secondary | ICD-10-CM | POA: Diagnosis not present

## 2016-05-17 DIAGNOSIS — S065X9D Traumatic subdural hemorrhage with loss of consciousness of unspecified duration, subsequent encounter: Secondary | ICD-10-CM | POA: Diagnosis not present

## 2016-05-17 DIAGNOSIS — Z79899 Other long term (current) drug therapy: Secondary | ICD-10-CM | POA: Diagnosis not present

## 2016-05-17 DIAGNOSIS — I129 Hypertensive chronic kidney disease with stage 1 through stage 4 chronic kidney disease, or unspecified chronic kidney disease: Secondary | ICD-10-CM | POA: Diagnosis not present

## 2016-05-17 DIAGNOSIS — N189 Chronic kidney disease, unspecified: Secondary | ICD-10-CM | POA: Diagnosis not present

## 2016-05-17 DIAGNOSIS — G2581 Restless legs syndrome: Secondary | ICD-10-CM | POA: Diagnosis not present

## 2016-05-17 DIAGNOSIS — F419 Anxiety disorder, unspecified: Secondary | ICD-10-CM | POA: Diagnosis not present

## 2016-05-19 DIAGNOSIS — G2581 Restless legs syndrome: Secondary | ICD-10-CM | POA: Diagnosis not present

## 2016-05-19 DIAGNOSIS — N189 Chronic kidney disease, unspecified: Secondary | ICD-10-CM | POA: Diagnosis not present

## 2016-05-19 DIAGNOSIS — I129 Hypertensive chronic kidney disease with stage 1 through stage 4 chronic kidney disease, or unspecified chronic kidney disease: Secondary | ICD-10-CM | POA: Diagnosis not present

## 2016-05-19 DIAGNOSIS — F419 Anxiety disorder, unspecified: Secondary | ICD-10-CM | POA: Diagnosis not present

## 2016-05-19 DIAGNOSIS — S065X9D Traumatic subdural hemorrhage with loss of consciousness of unspecified duration, subsequent encounter: Secondary | ICD-10-CM | POA: Diagnosis not present

## 2016-05-19 DIAGNOSIS — F0390 Unspecified dementia without behavioral disturbance: Secondary | ICD-10-CM | POA: Diagnosis not present

## 2016-05-20 DIAGNOSIS — F419 Anxiety disorder, unspecified: Secondary | ICD-10-CM | POA: Diagnosis not present

## 2016-05-20 DIAGNOSIS — S065X9D Traumatic subdural hemorrhage with loss of consciousness of unspecified duration, subsequent encounter: Secondary | ICD-10-CM | POA: Diagnosis not present

## 2016-05-20 DIAGNOSIS — G2581 Restless legs syndrome: Secondary | ICD-10-CM | POA: Diagnosis not present

## 2016-05-20 DIAGNOSIS — I129 Hypertensive chronic kidney disease with stage 1 through stage 4 chronic kidney disease, or unspecified chronic kidney disease: Secondary | ICD-10-CM | POA: Diagnosis not present

## 2016-05-20 DIAGNOSIS — N189 Chronic kidney disease, unspecified: Secondary | ICD-10-CM | POA: Diagnosis not present

## 2016-05-20 DIAGNOSIS — F0390 Unspecified dementia without behavioral disturbance: Secondary | ICD-10-CM | POA: Diagnosis not present

## 2016-05-23 DIAGNOSIS — I129 Hypertensive chronic kidney disease with stage 1 through stage 4 chronic kidney disease, or unspecified chronic kidney disease: Secondary | ICD-10-CM | POA: Diagnosis not present

## 2016-05-23 DIAGNOSIS — G2581 Restless legs syndrome: Secondary | ICD-10-CM | POA: Diagnosis not present

## 2016-05-23 DIAGNOSIS — F0390 Unspecified dementia without behavioral disturbance: Secondary | ICD-10-CM | POA: Diagnosis not present

## 2016-05-23 DIAGNOSIS — S065X9D Traumatic subdural hemorrhage with loss of consciousness of unspecified duration, subsequent encounter: Secondary | ICD-10-CM | POA: Diagnosis not present

## 2016-05-23 DIAGNOSIS — N189 Chronic kidney disease, unspecified: Secondary | ICD-10-CM | POA: Diagnosis not present

## 2016-05-23 DIAGNOSIS — F419 Anxiety disorder, unspecified: Secondary | ICD-10-CM | POA: Diagnosis not present

## 2016-05-24 DIAGNOSIS — I10 Essential (primary) hypertension: Secondary | ICD-10-CM | POA: Diagnosis not present

## 2016-05-24 DIAGNOSIS — F0391 Unspecified dementia with behavioral disturbance: Secondary | ICD-10-CM | POA: Diagnosis not present

## 2016-05-24 DIAGNOSIS — H6121 Impacted cerumen, right ear: Secondary | ICD-10-CM | POA: Diagnosis not present

## 2016-05-24 DIAGNOSIS — R262 Difficulty in walking, not elsewhere classified: Secondary | ICD-10-CM | POA: Diagnosis not present

## 2016-05-24 DIAGNOSIS — H9190 Unspecified hearing loss, unspecified ear: Secondary | ICD-10-CM | POA: Diagnosis not present

## 2016-05-25 DIAGNOSIS — G2581 Restless legs syndrome: Secondary | ICD-10-CM | POA: Diagnosis not present

## 2016-05-25 DIAGNOSIS — S065X9D Traumatic subdural hemorrhage with loss of consciousness of unspecified duration, subsequent encounter: Secondary | ICD-10-CM | POA: Diagnosis not present

## 2016-05-25 DIAGNOSIS — F419 Anxiety disorder, unspecified: Secondary | ICD-10-CM | POA: Diagnosis not present

## 2016-05-25 DIAGNOSIS — N189 Chronic kidney disease, unspecified: Secondary | ICD-10-CM | POA: Diagnosis not present

## 2016-05-25 DIAGNOSIS — I129 Hypertensive chronic kidney disease with stage 1 through stage 4 chronic kidney disease, or unspecified chronic kidney disease: Secondary | ICD-10-CM | POA: Diagnosis not present

## 2016-05-25 DIAGNOSIS — F0390 Unspecified dementia without behavioral disturbance: Secondary | ICD-10-CM | POA: Diagnosis not present

## 2016-05-27 DIAGNOSIS — S065X9D Traumatic subdural hemorrhage with loss of consciousness of unspecified duration, subsequent encounter: Secondary | ICD-10-CM | POA: Diagnosis not present

## 2016-05-27 DIAGNOSIS — F419 Anxiety disorder, unspecified: Secondary | ICD-10-CM | POA: Diagnosis not present

## 2016-05-27 DIAGNOSIS — F0390 Unspecified dementia without behavioral disturbance: Secondary | ICD-10-CM | POA: Diagnosis not present

## 2016-05-27 DIAGNOSIS — I129 Hypertensive chronic kidney disease with stage 1 through stage 4 chronic kidney disease, or unspecified chronic kidney disease: Secondary | ICD-10-CM | POA: Diagnosis not present

## 2016-05-27 DIAGNOSIS — G2581 Restless legs syndrome: Secondary | ICD-10-CM | POA: Diagnosis not present

## 2016-05-27 DIAGNOSIS — N189 Chronic kidney disease, unspecified: Secondary | ICD-10-CM | POA: Diagnosis not present

## 2016-05-30 DIAGNOSIS — G2581 Restless legs syndrome: Secondary | ICD-10-CM | POA: Diagnosis not present

## 2016-05-30 DIAGNOSIS — F419 Anxiety disorder, unspecified: Secondary | ICD-10-CM | POA: Diagnosis not present

## 2016-05-30 DIAGNOSIS — S065X9D Traumatic subdural hemorrhage with loss of consciousness of unspecified duration, subsequent encounter: Secondary | ICD-10-CM | POA: Diagnosis not present

## 2016-05-30 DIAGNOSIS — F0390 Unspecified dementia without behavioral disturbance: Secondary | ICD-10-CM | POA: Diagnosis not present

## 2016-05-30 DIAGNOSIS — I129 Hypertensive chronic kidney disease with stage 1 through stage 4 chronic kidney disease, or unspecified chronic kidney disease: Secondary | ICD-10-CM | POA: Diagnosis not present

## 2016-05-30 DIAGNOSIS — N189 Chronic kidney disease, unspecified: Secondary | ICD-10-CM | POA: Diagnosis not present

## 2016-06-02 DIAGNOSIS — F419 Anxiety disorder, unspecified: Secondary | ICD-10-CM | POA: Diagnosis not present

## 2016-06-02 DIAGNOSIS — F0390 Unspecified dementia without behavioral disturbance: Secondary | ICD-10-CM | POA: Diagnosis not present

## 2016-06-02 DIAGNOSIS — I129 Hypertensive chronic kidney disease with stage 1 through stage 4 chronic kidney disease, or unspecified chronic kidney disease: Secondary | ICD-10-CM | POA: Diagnosis not present

## 2016-06-02 DIAGNOSIS — G2581 Restless legs syndrome: Secondary | ICD-10-CM | POA: Diagnosis not present

## 2016-06-02 DIAGNOSIS — S065X9D Traumatic subdural hemorrhage with loss of consciousness of unspecified duration, subsequent encounter: Secondary | ICD-10-CM | POA: Diagnosis not present

## 2016-06-02 DIAGNOSIS — N189 Chronic kidney disease, unspecified: Secondary | ICD-10-CM | POA: Diagnosis not present

## 2016-06-07 DIAGNOSIS — F0390 Unspecified dementia without behavioral disturbance: Secondary | ICD-10-CM | POA: Diagnosis not present

## 2016-06-07 DIAGNOSIS — F419 Anxiety disorder, unspecified: Secondary | ICD-10-CM | POA: Diagnosis not present

## 2016-06-07 DIAGNOSIS — I129 Hypertensive chronic kidney disease with stage 1 through stage 4 chronic kidney disease, or unspecified chronic kidney disease: Secondary | ICD-10-CM | POA: Diagnosis not present

## 2016-06-07 DIAGNOSIS — N189 Chronic kidney disease, unspecified: Secondary | ICD-10-CM | POA: Diagnosis not present

## 2016-06-07 DIAGNOSIS — S065X9D Traumatic subdural hemorrhage with loss of consciousness of unspecified duration, subsequent encounter: Secondary | ICD-10-CM | POA: Diagnosis not present

## 2016-06-07 DIAGNOSIS — G2581 Restless legs syndrome: Secondary | ICD-10-CM | POA: Diagnosis not present

## 2016-06-08 DIAGNOSIS — S065X9D Traumatic subdural hemorrhage with loss of consciousness of unspecified duration, subsequent encounter: Secondary | ICD-10-CM | POA: Diagnosis not present

## 2016-06-08 DIAGNOSIS — F419 Anxiety disorder, unspecified: Secondary | ICD-10-CM | POA: Diagnosis not present

## 2016-06-08 DIAGNOSIS — G2581 Restless legs syndrome: Secondary | ICD-10-CM | POA: Diagnosis not present

## 2016-06-08 DIAGNOSIS — N189 Chronic kidney disease, unspecified: Secondary | ICD-10-CM | POA: Diagnosis not present

## 2016-06-08 DIAGNOSIS — F0390 Unspecified dementia without behavioral disturbance: Secondary | ICD-10-CM | POA: Diagnosis not present

## 2016-06-08 DIAGNOSIS — I129 Hypertensive chronic kidney disease with stage 1 through stage 4 chronic kidney disease, or unspecified chronic kidney disease: Secondary | ICD-10-CM | POA: Diagnosis not present

## 2016-06-27 DIAGNOSIS — Z79899 Other long term (current) drug therapy: Secondary | ICD-10-CM | POA: Diagnosis not present

## 2016-08-16 DIAGNOSIS — Z79899 Other long term (current) drug therapy: Secondary | ICD-10-CM | POA: Diagnosis not present

## 2016-08-24 DIAGNOSIS — Z79899 Other long term (current) drug therapy: Secondary | ICD-10-CM | POA: Diagnosis not present

## 2016-09-06 DIAGNOSIS — Z79899 Other long term (current) drug therapy: Secondary | ICD-10-CM | POA: Diagnosis not present

## 2016-10-04 DIAGNOSIS — Z79899 Other long term (current) drug therapy: Secondary | ICD-10-CM | POA: Diagnosis not present

## 2016-10-13 DIAGNOSIS — Z79899 Other long term (current) drug therapy: Secondary | ICD-10-CM | POA: Diagnosis not present

## 2016-11-29 DIAGNOSIS — Z79899 Other long term (current) drug therapy: Secondary | ICD-10-CM | POA: Diagnosis not present

## 2016-12-05 ENCOUNTER — Encounter: Payer: Self-pay | Admitting: Emergency Medicine

## 2016-12-05 ENCOUNTER — Emergency Department: Payer: Medicare HMO

## 2016-12-05 ENCOUNTER — Emergency Department
Admission: EM | Admit: 2016-12-05 | Discharge: 2016-12-05 | Disposition: A | Discharge status: Home / Self Care | Payer: Medicare HMO | Attending: Emergency Medicine | Admitting: Emergency Medicine

## 2016-12-05 DIAGNOSIS — Y999 Unspecified external cause status: Secondary | ICD-10-CM | POA: Diagnosis not present

## 2016-12-05 DIAGNOSIS — S0990XA Unspecified injury of head, initial encounter: Secondary | ICD-10-CM | POA: Diagnosis not present

## 2016-12-05 DIAGNOSIS — F329 Major depressive disorder, single episode, unspecified: Secondary | ICD-10-CM | POA: Diagnosis not present

## 2016-12-05 DIAGNOSIS — I10 Essential (primary) hypertension: Secondary | ICD-10-CM | POA: Insufficient documentation

## 2016-12-05 DIAGNOSIS — R5383 Other fatigue: Secondary | ICD-10-CM | POA: Diagnosis not present

## 2016-12-05 DIAGNOSIS — Y929 Unspecified place or not applicable: Secondary | ICD-10-CM | POA: Diagnosis not present

## 2016-12-05 DIAGNOSIS — F0390 Unspecified dementia without behavioral disturbance: Secondary | ICD-10-CM | POA: Diagnosis not present

## 2016-12-05 DIAGNOSIS — Z87891 Personal history of nicotine dependence: Secondary | ICD-10-CM | POA: Diagnosis not present

## 2016-12-05 DIAGNOSIS — Y939 Activity, unspecified: Secondary | ICD-10-CM | POA: Insufficient documentation

## 2016-12-05 DIAGNOSIS — S51012A Laceration without foreign body of left elbow, initial encounter: Secondary | ICD-10-CM | POA: Diagnosis not present

## 2016-12-05 DIAGNOSIS — R509 Fever, unspecified: Secondary | ICD-10-CM | POA: Diagnosis not present

## 2016-12-05 DIAGNOSIS — R262 Difficulty in walking, not elsewhere classified: Secondary | ICD-10-CM | POA: Diagnosis not present

## 2016-12-05 DIAGNOSIS — Z79899 Other long term (current) drug therapy: Secondary | ICD-10-CM | POA: Insufficient documentation

## 2016-12-05 DIAGNOSIS — W19XXXA Unspecified fall, initial encounter: Secondary | ICD-10-CM | POA: Diagnosis not present

## 2016-12-05 DIAGNOSIS — S59902A Unspecified injury of left elbow, initial encounter: Secondary | ICD-10-CM | POA: Diagnosis present

## 2016-12-05 DIAGNOSIS — D649 Anemia, unspecified: Secondary | ICD-10-CM | POA: Diagnosis not present

## 2016-12-05 DIAGNOSIS — R54 Age-related physical debility: Secondary | ICD-10-CM | POA: Diagnosis not present

## 2016-12-05 LAB — URINALYSIS, COMPLETE (UACMP) WITH MICROSCOPIC
BACTERIA UA: NONE SEEN
BILIRUBIN URINE: NEGATIVE
Glucose, UA: NEGATIVE mg/dL
Hgb urine dipstick: NEGATIVE
KETONES UR: 5 mg/dL — AB
Leukocytes, UA: NEGATIVE
Nitrite: NEGATIVE
PH: 5 (ref 5.0–8.0)
PROTEIN: 30 mg/dL — AB
Specific Gravity, Urine: 1.019 (ref 1.005–1.030)

## 2016-12-05 LAB — CBC
HCT: 35.7 % — ABNORMAL LOW (ref 40.0–52.0)
Hemoglobin: 12 g/dL — ABNORMAL LOW (ref 13.0–18.0)
MCH: 29.7 pg (ref 26.0–34.0)
MCHC: 33.6 g/dL (ref 32.0–36.0)
MCV: 88.4 fL (ref 80.0–100.0)
PLATELETS: 118 10*3/uL — AB (ref 150–440)
RBC: 4.03 MIL/uL — AB (ref 4.40–5.90)
RDW: 14.6 % — ABNORMAL HIGH (ref 11.5–14.5)
WBC: 4.8 10*3/uL (ref 3.8–10.6)

## 2016-12-05 LAB — BASIC METABOLIC PANEL
Anion gap: 6 (ref 5–15)
BUN: 18 mg/dL (ref 6–20)
CALCIUM: 8.8 mg/dL — AB (ref 8.9–10.3)
CO2: 27 mmol/L (ref 22–32)
CREATININE: 1.48 mg/dL — AB (ref 0.61–1.24)
Chloride: 104 mmol/L (ref 101–111)
GFR, EST AFRICAN AMERICAN: 45 mL/min — AB (ref >60)
GFR, EST NON AFRICAN AMERICAN: 38 mL/min — AB (ref >60)
Glucose, Bld: 120 mg/dL — ABNORMAL HIGH (ref 65–99)
Potassium: 3.5 mmol/L (ref 3.5–5.1)
SODIUM: 137 mmol/L (ref 135–145)

## 2016-12-05 MED ORDER — BACITRACIN ZINC 500 UNIT/GM EX OINT
TOPICAL_OINTMENT | CUTANEOUS | Status: AC
  Filled 2016-12-05: qty 0.9

## 2016-12-05 MED ORDER — BACITRACIN-NEOMYCIN-POLYMYXIN 400-5-5000 EX OINT: TOPICAL_OINTMENT | Freq: Once | CUTANEOUS | Status: DC

## 2016-12-05 NOTE — ED Provider Notes (Addendum)
[facility] Emergency Department Provider Note  ____________________________________________  Time seen: Approximately 6:19 PM  I have reviewed the triage vital signs and the nursing notes.   HISTORY  Chief Complaint Fall  The patient has some dementia which limits his history; he is accompanied by his son who gives additional history but was not there today.  HPI Aaron Ellis is a 83 y.o. male who lives in assisted living presenting for 2 falls today.The patient's son states that the patient's normal caregiver has been sick and was not able to come to the home today. Per report, the patient slipped on some feces that was on the ground earlier in the day. He is denying any nausea vomiting or diarrhea, or abdominal pain. The details of the second fall are unknown. The patient states "I feel great" and does not have any pain. He denies any headache, visual changes, chest pain, shortness of breath, syncope. After his falls he was able to ambulate with a cane which is baseline. He states that he is not on any medications, has not had any recent illness. The patient's son states the patient has had normal mental status baseline.   Past Medical History:  Diagnosis Date  . Hypertension     Patient Active Problem List   Diagnosis Date Noted  . Protein-calorie malnutrition, severe 04/16/2016  . Subdural hematoma ([place]) 04/14/2016    Past Surgical History:  Procedure Laterality Date  . CATARACT EXTRACTION      Current Outpatient Rx  . Order #: [id number] Class: Historical Med  . Order #: [id number] Class: Historical Med  . Order #: [id number] Class: Historical Med  . Order #: [id number] Class: Historical Med  . Order #: [id number] Class: Historical Med  . Order #: [id number] Class: Historical Med  . Order #: [id number] Class: Historical Med  . Order #: [id number] Class: Historical Med  . Order #: [id number] Class: Historical Med  . Order #: [id number] Class: Historical Med  .  Order #: [id number] Class: Print  . Order #: [id number] Class: Historical Med  . Order #: [id number] Class: Historical Med    Allergies Patient has no known allergies.  Family History  Problem Relation Age of Onset  . Hypertension Other     Social History Social History  Substance Use Topics  . Smoking status: Former Research scientist (life sciences)  . Smokeless tobacco: Never Used  . Alcohol use Not on file    Review of Systems Constitutional: No fever/chills.No lightheadedness or syncope. Positive fall. Eyes: No visual changes. No blurred or double vision. ENT: No sore throat. No congestion or rhinorrhea. Cardiovascular: Denies chest pain. Denies palpitations. Respiratory: Denies shortness of breath.  No cough. Gastrointestinal: No abdominal pain.  No nausea, no vomiting.  No diarrhea.  No constipation. Genitourinary: Negative for dysuria. Musculoskeletal: Negative for back pain. No neck pain. No hip pain. No pain in the joints. Skin: Negative for rash. Positive small skin tear over the left lateral elbow. Neurological: Negative for headaches. No focal numbness, tingling or weakness.   10-point ROS otherwise negative.  ____________________________________________   PHYSICAL EXAM:  VITAL SIGNS: ED Triage Vitals  Enc Vitals Group     BP 12/05/16 1731 116/71     Pulse Rate 12/05/16 1731 88     Resp 12/05/16 1731 18     Temp 12/05/16 1731 (!) 100.5 F (38.1 C)     Temp Source 12/05/16 1731 Oral     SpO2 12/05/16 1731 98 %     Weight 12/05/16 1734 160 lb (72.6 kg)     Height  12/05/16 1734 6' (1.829 m)     Head Circumference --      Peak Flow --      Pain Score 12/05/16 1735 0     Pain Loc --      Pain Edu? --      Excl. in [place]? --     Constitutional: The patient is alert and answering questions appropriately. His speech is normal. He makes good eye contact. He is comfortable appearing and able to move around the stretcher without any pain. Eyes: Conjunctivae are normal.  EOMI. PERRLA. No  scleral icterus. Head: Atraumatic. No raccoon eyes or Battle sign. Nose: No congestion/rhinnorhea. No swelling over the nose or septal hematoma. Mouth/Throat: Mucous membranes are moist. No malocclusion or dental injury. Neck: No stridor.  Supple.  No midline C-spine tenderness to palpation, step-offs or deformity. Full range of motion without pain. Cardiovascular: Normal rate, regular rhythm. No murmurs, rubs or gallops.  Respiratory: Normal respiratory effort.  No accessory muscle use or retractions. Lungs CTAB.  No wheezes, rales or ronchi. Gastrointestinal: Soft, nontender and nondistended.  No guarding or rebound.  No peritoneal signs. Musculoskeletal: No LE edema. No ttp in the calves or palpable cords.  Negative Homan's sign. Pelvis is stable. Full range of motion of the bilateral ankles, knees, hips, wrists, elbows, shoulders without pain. Neurologic:  A&Ox3.  Speech is clear.  Face and smile are symmetric.  EOMI.  Moves all extremities well. Skin:  Skin is warm, dry. 1 cm superficial skin tear over the left lateral elbow without any underlying elbow effusion or pain with range of motion of the elbow. Psychiatric: Mood and affect are normal. Speech and behavior are normal.  ____________________________________________   LABS (all labs ordered are listed, but only abnormal results are displayed)  Labs Reviewed  URINALYSIS, COMPLETE (UACMP) WITH MICROSCOPIC - Abnormal; Notable for the following:       Result Value   Color, Urine YELLOW (*)    APPearance CLEAR (*)    Ketones, ur 5 (*)    Protein, ur 30 (*)    Squamous Epithelial / LPF 0-5 (*)    All other components within normal limits  CBC - Abnormal; Notable for the following:    RBC 4.03 (*)    Hemoglobin 12.0 (*)    HCT 35.7 (*)    RDW 14.6 (*)    Platelets 118 (*)    All other components within normal limits  BASIC METABOLIC PANEL - Abnormal; Notable for the following:    Glucose, Bld 120 (*)    Creatinine, Ser 1.48 (*)     Calcium 8.8 (*)    GFR calc non Af Amer 38 (*)    GFR calc Af Amer 45 (*)    All other components within normal limits   ____________________________________________  EKG  ED ECG REPORT I, Eula Listen, the attending physician, personally viewed and interpreted this ECG.   Date: 12/05/2016  EKG Time: 1936  Rate: 83  Rhythm: normal sinus rhythm  Axis: normal  Intervals:none  ST&T Change: No STEMI.  + PVC  ____________________________________________  RADIOLOGY  Ct Head Wo Contrast  Result Date: 12/05/2016 CLINICAL DATA:  [name] at assisted living facility today. History of dementia, hypertension. EXAM: CT HEAD WITHOUT CONTRAST TECHNIQUE: Contiguous axial images were obtained from the base of the skull through the vertex without intravenous contrast. COMPARISON:  CT HEAD Apr 24, 2016 FINDINGS: BRAIN: The ventricles and sulci are normal for age. No intraparenchymal hemorrhage, mass effect  nor midline shift. Patchy supratentorial white matter hypodensities less than expected for patient's age, though non-specific are most compatible with chronic small vessel ischemic disease. No acute large vascular territory infarcts. Similar mild asymmetrically prominent LEFT frontal parietal extra-axial space. No significant mass effect. Basal cisterns are patent. VASCULAR: Minimal calcific atherosclerosis of the carotid siphons. SKULL: No skull fracture. No significant scalp soft tissue swelling. SINUSES/ORBITS: Trace paranasal sinus mucosal thickening. Mastoid air cells are well aerated. Status post bilateral ocular lens implants. The included ocular globes and orbital contents are non-suspicious. Soft tissue within the bilateral external auditory canals, possible tube on the LEFT. OTHER: None. IMPRESSION: No acute intracranial process. Old small LEFT subdural hematoma versus hygroma without significant mass effect; otherwise negative CT HEAD for age. Electronically Signed   By: Elon Alas  M.D.   On: 12/05/2016 18:37    ____________________________________________   PROCEDURES  Procedure(s) performed: None  Procedures  Critical Care performed: No ____________________________________________   INITIAL IMPRESSION / ASSESSMENT AND PLAN / ED COURSE  Pertinent labs & imaging results that were available during my care of the patient were reviewed by me and considered in my medical decision making (see chart for details).  83 y.o. male who walks with a cane at baseline presenting with 2 falls today of which we have some details but not all. Overall, the patient is well-appearing with stable vital signs and has no evidence of acute injury other than a small skin tear on his left elbow. Given that the patient is able to give a limited history, we will get a CT of the head, screening EKG, and basic labs including urinalysis. The patient had one temperature which was febrile here, but not any intervention this has been repeated multiple times without fever so it may have been an aberrant reading. The patient has not had any signs or symptoms consistent with infection. Plan reevaluation for final disposition.  The patient's CT scan shows NAICP.  He has some mild renal insufficiency, within his baseline.  Today his hgb 12.0, down from just over 13, so I will get a rectal exam.  It is unlikely this anemia caused syncope.  I am awaiting the results of his catheterized urine sample, and anticipate d/c home.  Rectal exam shows no evid of external or palpable internal hemorrhoids, brown stool, guiac negative.  ----------------------------------------- 8:57 PM on 12/05/2016 -----------------------------------------  The patient's workup in the emergency department is reassuring. He continues to be at his baseline. He has been ambulatory without difficulty. Plan discharge.  ____________________________________________  FINAL CLINICAL IMPRESSION(S) / ED DIAGNOSES  Final diagnoses:   Fall, initial encounter  Skin tear of left elbow without complication, initial encounter  Anemia, unspecified type    Clinical Course       NEW MEDICATIONS STARTED DURING THIS VISIT:  New Prescriptions   No medications on file      Eula Listen, MD 12/05/16 1955    Eula Listen, MD 12/05/16 2057

## 2016-12-05 NOTE — ED Notes (Signed)
Pt. Family Verbalizes understanding of d/c instructions and follow-up. VS stable and pain controlled per pt.  Pt. In NAD at time of d/c and denies further concerns regarding this visit. Pt. Stable at the time of departure from the unit, departing unit by the safest and most appropriate manner per that pt condition and limitations. Pt advised to return to the ED at any time for emergent concerns, or for new/worsening symptoms.

## 2016-12-05 NOTE — ED Notes (Signed)
Staff reports to EMS pt fell today.  EMS noted low grade fever.  Pt denies pain with palpation.

## 2016-12-05 NOTE — Discharge Instructions (Signed)
Continue to apply Neosporin or any triple antibiotic cream 3 times daily and a thick coat until your skin tear has completely healed. Please make a follow-up appointment with your primary care physician. Please take all precautions when walking, to prevent falls.  Return to the emergency department if you develop severe pain, lightheadedness or fainting, vomiting, changes in mental status, fever, or any other symptoms concerning to you.

## 2016-12-05 NOTE — ED Triage Notes (Signed)
Pt arrived alert with dementia via EMS.  Staff at assisted living reports fall.

## 2016-12-05 NOTE — ED Notes (Signed)
Pt. Son in law verbalized understanding d/c instructions. Signed for d/c. [facility] Verbalized to take pt back to [facility] informed. Pt. To [facility] car by wheelchair by Mickel Baas, EDT.

## 2016-12-05 NOTE — ED Notes (Signed)
Pt chart is locked and this tech can not print a yellow sticker for the urine. Spoke with Jeanette Caprice from lab and she said it was ok to send urine with white sticker because she was able to get in chart.

## 2016-12-07 DIAGNOSIS — F4310 Post-traumatic stress disorder, unspecified: Secondary | ICD-10-CM | POA: Diagnosis not present

## 2016-12-07 DIAGNOSIS — Z79899 Other long term (current) drug therapy: Secondary | ICD-10-CM | POA: Diagnosis not present

## 2016-12-07 DIAGNOSIS — R54 Age-related physical debility: Secondary | ICD-10-CM | POA: Diagnosis not present

## 2016-12-07 DIAGNOSIS — F0391 Unspecified dementia with behavioral disturbance: Secondary | ICD-10-CM | POA: Diagnosis not present

## 2016-12-07 DIAGNOSIS — E039 Hypothyroidism, unspecified: Secondary | ICD-10-CM | POA: Diagnosis not present

## 2016-12-07 DIAGNOSIS — I10 Essential (primary) hypertension: Secondary | ICD-10-CM | POA: Diagnosis not present

## 2016-12-07 DIAGNOSIS — H9190 Unspecified hearing loss, unspecified ear: Secondary | ICD-10-CM | POA: Diagnosis not present

## 2016-12-07 DIAGNOSIS — G3184 Mild cognitive impairment, so stated: Secondary | ICD-10-CM | POA: Diagnosis not present

## 2016-12-09 ENCOUNTER — Encounter: Payer: Self-pay | Admitting: Emergency Medicine

## 2016-12-09 ENCOUNTER — Emergency Department
Admission: EM | Admit: 2016-12-09 | Discharge: 2016-12-09 | Disposition: A | Discharge status: Home / Self Care | Payer: Medicare HMO | Attending: Emergency Medicine | Admitting: Emergency Medicine

## 2016-12-09 ENCOUNTER — Emergency Department: Payer: Medicare HMO

## 2016-12-09 DIAGNOSIS — Z87891 Personal history of nicotine dependence: Secondary | ICD-10-CM | POA: Diagnosis not present

## 2016-12-09 DIAGNOSIS — Z79899 Other long term (current) drug therapy: Secondary | ICD-10-CM | POA: Diagnosis not present

## 2016-12-09 DIAGNOSIS — J40 Bronchitis, not specified as acute or chronic: Secondary | ICD-10-CM | POA: Diagnosis not present

## 2016-12-09 DIAGNOSIS — I10 Essential (primary) hypertension: Secondary | ICD-10-CM | POA: Diagnosis not present

## 2016-12-09 DIAGNOSIS — R197 Diarrhea, unspecified: Secondary | ICD-10-CM | POA: Insufficient documentation

## 2016-12-09 DIAGNOSIS — R05 Cough: Secondary | ICD-10-CM | POA: Diagnosis present

## 2016-12-09 DIAGNOSIS — R1110 Vomiting, unspecified: Secondary | ICD-10-CM | POA: Diagnosis not present

## 2016-12-09 DIAGNOSIS — R140 Abdominal distension (gaseous): Secondary | ICD-10-CM | POA: Diagnosis not present

## 2016-12-09 LAB — CBC WITH DIFFERENTIAL/PLATELET
BASOS ABS: 0 10*3/uL (ref 0–0.1)
BASOS PCT: 1 %
Eosinophils Absolute: 0.1 10*3/uL (ref 0–0.7)
Eosinophils Relative: 2 %
HEMATOCRIT: 38.9 % — AB (ref 40.0–52.0)
Hemoglobin: 13.4 g/dL (ref 13.0–18.0)
LYMPHS PCT: 32 %
Lymphs Abs: 0.9 10*3/uL — ABNORMAL LOW (ref 1.0–3.6)
MCH: 30.3 pg (ref 26.0–34.0)
MCHC: 34.3 g/dL (ref 32.0–36.0)
MCV: 88.4 fL (ref 80.0–100.0)
Monocytes Absolute: 0.4 10*3/uL (ref 0.2–1.0)
Monocytes Relative: 15 %
NEUTROS ABS: 1.4 10*3/uL (ref 1.4–6.5)
NEUTROS PCT: 50 %
Platelets: 118 10*3/uL — ABNORMAL LOW (ref 150–440)
RBC: 4.4 MIL/uL (ref 4.40–5.90)
RDW: 14.8 % — AB (ref 11.5–14.5)
WBC: 2.9 10*3/uL — AB (ref 3.8–10.6)

## 2016-12-09 LAB — COMPREHENSIVE METABOLIC PANEL
ALBUMIN: 3.9 g/dL (ref 3.5–5.0)
ALT: 16 U/L — AB (ref 17–63)
AST: 38 U/L (ref 15–41)
Alkaline Phosphatase: 74 U/L (ref 38–126)
Anion gap: 10 (ref 5–15)
BILIRUBIN TOTAL: 0.8 mg/dL (ref 0.3–1.2)
BUN: 23 mg/dL — AB (ref 6–20)
CHLORIDE: 104 mmol/L (ref 101–111)
CO2: 24 mmol/L (ref 22–32)
CREATININE: 1.52 mg/dL — AB (ref 0.61–1.24)
Calcium: 9 mg/dL (ref 8.9–10.3)
GFR calc Af Amer: 43 mL/min — ABNORMAL LOW (ref >60)
GFR, EST NON AFRICAN AMERICAN: 37 mL/min — AB (ref >60)
GLUCOSE: 109 mg/dL — AB (ref 65–99)
POTASSIUM: 3.4 mmol/L — AB (ref 3.5–5.1)
Sodium: 138 mmol/L (ref 135–145)
TOTAL PROTEIN: 7 g/dL (ref 6.5–8.1)

## 2016-12-09 LAB — LIPASE, BLOOD: Lipase: 25 U/L (ref 11–51)

## 2016-12-09 LAB — TROPONIN I

## 2016-12-09 MED ORDER — PREDNISONE 20 MG PO TABS
60.0000 mg | ORAL_TABLET | Freq: Once | ORAL | Status: AC
  Administered 2016-12-09: 60 mg via ORAL
  Filled 2016-12-09: qty 3

## 2016-12-09 MED ORDER — DOXYCYCLINE HYCLATE 100 MG PO TABS: 100.0000 mg | ORAL_TABLET | Freq: Two times a day (BID) | ORAL | 0 refills | Status: DC

## 2016-12-09 MED ORDER — IPRATROPIUM-ALBUTEROL 0.5-2.5 (3) MG/3ML IN SOLN
9.0000 mL | Freq: Once | RESPIRATORY_TRACT | Status: AC
  Administered 2016-12-09: 9 mL via RESPIRATORY_TRACT
  Filled 2016-12-09: qty 9

## 2016-12-09 MED ORDER — PREDNISONE 20 MG PO TABS: 40.0000 mg | ORAL_TABLET | Freq: Every day | ORAL | 0 refills | Status: DC

## 2016-12-09 MED ORDER — ALBUTEROL SULFATE (2.5 MG/3ML) 0.083% IN NEBU
2.5000 mg | INHALATION_SOLUTION | Freq: Once | RESPIRATORY_TRACT | Status: AC
  Administered 2016-12-09: 2.5 mg via RESPIRATORY_TRACT
  Filled 2016-12-09: qty 3

## 2016-12-09 MED ORDER — DOXYCYCLINE HYCLATE 100 MG PO TABS
100.0000 mg | ORAL_TABLET | Freq: Once | ORAL | Status: AC
Start: 2016-12-09 — End: 2016-12-09
  Administered 2016-12-09: 100 mg via ORAL
  Filled 2016-12-09: qty 1

## 2016-12-09 MED ORDER — ALBUTEROL SULFATE HFA 108 (90 BASE) MCG/ACT IN AERS: 2.0000 | INHALATION_SPRAY | Freq: Four times a day (QID) | RESPIRATORY_TRACT | 0 refills | Status: DC | PRN

## 2016-12-09 NOTE — ED Provider Notes (Signed)
[facility] Emergency Department Provider Note  ____________________________________________   First MD Initiated Contact with Patient 12/09/16 1504     (approximate)  I have reviewed the triage vital signs and the nursing notes.   HISTORY  Chief Complaint Fatigue   HPI Aaron Ellis is a 83 y.o. male with a history of hypertension and dementia who is presenting to the emergency department today with cough, wheezing as well as diarrhea and weakness. He is accompanied by his daughter who says that he has had several falls recently and was evaluated in this emergency department several days ago. She says that he is acting at his baseline level of behavior but that he has had a deep cough over the past several days. The daughter denies the patient having COPD.  The patient does not complain of any pain. However, his daughter says this is typical for him to not complain at all. It is unclear how many times a day the patient is having diarrhea. However, the daughter says that according to the staff that his diarrhea has persisted   Past Medical History:  Diagnosis Date  . Hypertension     Patient Active Problem List   Diagnosis Date Noted  . Protein-calorie malnutrition, severe 04/16/2016  . Subdural hematoma ([place]) 04/14/2016    Past Surgical History:  Procedure Laterality Date  . CATARACT EXTRACTION      Prior to Admission medications   Medication Sig Start Date End Date Taking? Authorizing Provider  escitalopram (LEXAPRO) 10 MG tablet Take 10 mg by mouth every morning.   Yes Historical Provider, MD  finasteride (PROSCAR) 5 MG tablet Take 5 mg by mouth daily.   Yes Historical Provider, MD  folic acid (FOLVITE) 1 MG tablet Take 1 mg by mouth daily.   Yes Historical Provider, MD  guaiFENesin (ROBITUSSIN) 100 MG/5ML SOLN Take 10 mLs by mouth every 6 (six) hours as needed for cough.   Yes Historical Provider, MD  levothyroxine (SYNTHROID, LEVOTHROID) 75  MCG tablet Take 75 mcg by mouth daily before breakfast.   Yes Historical Provider, MD  loperamide (IMODIUM) 2 MG capsule Take 2 mg by mouth as needed for diarrhea or loose stools.   Yes Historical Provider, MD  magnesium hydroxide (MILK OF MAGNESIA) 400 MG/5ML suspension Take 30 mLs by mouth at bedtime as needed for mild constipation.   Yes Historical Provider, MD  neomycin-bacitracin-polymyxin (NEOSPORIN) [phone] ointment Apply 1 application topically 2 (two) times daily. Apply to great right toe nailbed   Yes Historical Provider, MD  NIFEdipine (PROCARDIA-XL/ADALAT-CC/NIFEDICAL-XL) 30 MG 24 hr tablet Take 30 mg by mouth daily.   Yes Historical Provider, MD  QUEtiapine (SEROQUEL) 25 MG tablet Take 1 tablet (25 mg total) by mouth every 8 (eight) hours as needed (agitation). 04/18/16  Yes Dustin Flock, MD  tamsulosin (FLOMAX) 0.4 MG CAPS capsule Take 0.4 mg by mouth daily.   Yes Historical Provider, MD  triamcinolone cream (KENALOG) 0.1 % Apply 1 application topically 2 (two) times daily. Apply to facial and neck rash daily at 0800 and 2000   Yes Historical Provider, MD  alum & mag hydroxide-simeth (MAALOX PLUS) 400-400-40 MG/5ML suspension Take 30 mLs by mouth every 6 (six) hours as needed for indigestion.    Historical Provider, MD    Allergies Corn starch and Corn-containing products  Family History  Problem Relation Age of Onset  . Hypertension Other     Social History Social History  Substance Use Topics  . Smoking status:  Former Smoker  . Smokeless tobacco: Never Used  . Alcohol use Not on file    Review of Systems Constitutional: No fever/chills Eyes: No visual changes. ENT: No sore throat. Cardiovascular: Denies chest pain. Respiratory: as above. Gastrointestinal: No abdominal pain.  No nausea, no vomiting.  No constipation. Genitourinary: Negative for dysuria. Musculoskeletal: Negative for back pain. Skin: Negative for rash. Neurological: Negative for headaches, focal  weakness or numbness.  10-point ROS otherwise negative.  ____________________________________________   PHYSICAL EXAM:  VITAL SIGNS: ED Triage Vitals  Enc Vitals Group     BP 12/09/16 1257 (!) 128/56     Pulse Rate 12/09/16 1257 (!) 111     Resp 12/09/16 1257 15     Temp 12/09/16 1257 98.1 F (36.7 C)     Temp Source 12/09/16 1257 Oral     SpO2 12/09/16 1257 93 %     Weight 12/09/16 1300 160 lb (72.6 kg)     Height 12/09/16 1300 5\' 10"  (1.778 m)     Head Circumference --      Peak Flow --      Pain Score 12/09/16 1150 0     Pain Loc --      Pain Edu? --      Excl. in [place]? --     Constitutional: Alert. Well appearing and in no acute distress. Eyes: Conjunctivae are normal. PERRL. EOMI. Head: Atraumatic. Nose: No congestion/rhinnorhea. Mouth/Throat: Mucous membranes are moist.  Oropharynx non-erythematous. Neck: No stridor.   Cardiovascular: Normal rate, regular rhythm. Grossly normal heart sounds.  Good peripheral circulation. Respiratory: Normal respiratory effort.  No retractions.  Coarse wheezes throughout. Gastrointestinal: Soft and nontender. No distention.  Musculoskeletal: No lower extremity tenderness nor edema.  No joint effusions. Neurologic:  Normal speech and language. No gross focal neurologic deficits are appreciated.  Skin:  Skin is warm, dry and intact. No rash noted. Psychiatric: Mood and affect are normal. Speech and behavior are normal.  ____________________________________________   LABS (all labs ordered are listed, but only abnormal results are displayed)  Labs Reviewed  CBC WITH DIFFERENTIAL/PLATELET - Abnormal; Notable for the following:       Result Value   WBC 2.9 (*)    HCT 38.9 (*)    RDW 14.8 (*)    Platelets 118 (*)    Lymphs Abs 0.9 (*)    All other components within normal limits  COMPREHENSIVE METABOLIC PANEL - Abnormal; Notable for the following:    Potassium 3.4 (*)    Glucose, Bld 109 (*)    BUN 23 (*)    Creatinine, Ser  1.52 (*)    ALT 16 (*)    GFR calc non Af Amer 37 (*)    GFR calc Af Amer 43 (*)    All other components within normal limits  GASTROINTESTINAL PANEL BY PCR, STOOL (REPLACES STOOL CULTURE)  C DIFFICILE QUICK SCREEN W PCR REFLEX  TROPONIN I  LIPASE, BLOOD  URINALYSIS, COMPLETE (UACMP) WITH MICROSCOPIC   ____________________________________________  EKG  ED ECG REPORT I, Doran Stabler, the attending physician, personally viewed and interpreted this ECG.   Date: 12/09/2016  EKG Time: 1306  Rate: 84  Rhythm: Sinus rhythm with sinus arrhythmia and frequent PVCs.  Axis: Normal  Intervals:Ellis  ST&T Change: No ST segment elevation or depression. No abnormal T-wave inversion.  ____________________________________________  [id number]  DG Chest 2 View (Final result)  Result time 12/09/16 15:50:22  Final result by Marybelle Killings, MD (12/09/16 15:50:22)  Narrative:   CLINICAL DATA: Vomiting and diarrhea  EXAM: CHEST 2 VIEW  COMPARISON: Ellis available.  FINDINGS: There is a left suprahilar spiculated density. Lungs otherwise clear. Upper normal heart size. Hyperaeration. No pneumothorax or pleural effusion.  IMPRESSION: Possible left suprahilar mass. A comparison with prior studies would be helpful. If Ellis are available, CT can be performed to rule out mass.   Electronically Signed By: Marybelle Killings M.D. On: 12/09/2016 15:50            ____________________________________________   PROCEDURES  Procedure(s) performed:   Procedures  Critical Care performed:   ____________________________________________   INITIAL IMPRESSION / ASSESSMENT AND PLAN / ED COURSE  Pertinent labs & imaging results that were available during my care of the patient were reviewed by me and considered in my medical decision making (see chart for details).   Clinical Course    ----------------------------------------- 6:16 PM on  12/09/2016 -----------------------------------------  Patient still with scant wheezes but feels well. Still with normal mentation. Family members at the bedside. Patient wishing to return to his care facility. I feel this is appropriate at this time. He'll be discharged home with an inhaler as needed as well as doxycycline and prednisone. The family is understanding of the plan and willing to comply. No episodes of diarrhea in the emergency department. Likely bronchitis. Family also aware of the mass and need for further imaging. However, she understands the limitations of treatment secondary to the patient's dementia NH. They will follow up with her primary care doctor.  ____________________________________________   FINAL CLINICAL IMPRESSION(S) / ED DIAGNOSES  Bronchitis.    NEW MEDICATIONS STARTED DURING THIS VISIT:  New Prescriptions   No medications on file     Note:  This document was prepared using Dragon voice recognition software and may include unintentional dictation errors.    Orbie Pyo, MD 12/09/16 [phone]

## 2016-12-09 NOTE — ED Notes (Signed)
[facility] to give report and tell them to expect patient soon.  Daughter taking patient back to [facility].

## 2016-12-09 NOTE — ED Notes (Signed)
Pt. Family states [facility] facility states pt. Still had diarrhea as of this morning.

## 2016-12-09 NOTE — ED Notes (Signed)
Pt. Going to [facility] with wife.

## 2016-12-09 NOTE — ED Notes (Signed)
Pt. Daughter states patient was here this past Monday for vomiting and diarrhea.  Family reports pt. Has been feeling tired past couple days with cough.  Pt. Here with daughter from [place].

## 2016-12-09 NOTE — ED Notes (Signed)
Asked pt.  If he could give urine or diarrhea specimen, pt. Can not give specimen at this time.

## 2016-12-09 NOTE — ED Triage Notes (Signed)
Pt brought in by staff at [facility].  Reports generalized weakness and cough and nausea.  Pt alert, dementia, smiling, no resp distress

## 2016-12-10 MED ORDER — DOXYCYCLINE HYCLATE 100 MG PO TABS: 100.0000 mg | ORAL_TABLET | Freq: Two times a day (BID) | ORAL | 0 refills | Status: DC

## 2016-12-10 MED ORDER — PREDNISONE 20 MG PO TABS: 40.0000 mg | ORAL_TABLET | Freq: Every day | ORAL | 0 refills | Status: DC

## 2016-12-10 MED ORDER — ALBUTEROL SULFATE HFA 108 (90 BASE) MCG/ACT IN AERS: 2.0000 | INHALATION_SPRAY | Freq: Four times a day (QID) | RESPIRATORY_TRACT | 0 refills | Status: AC | PRN

## 2016-12-14 DIAGNOSIS — H9190 Unspecified hearing loss, unspecified ear: Secondary | ICD-10-CM | POA: Diagnosis not present

## 2016-12-14 DIAGNOSIS — I10 Essential (primary) hypertension: Secondary | ICD-10-CM | POA: Diagnosis not present

## 2016-12-14 DIAGNOSIS — F4310 Post-traumatic stress disorder, unspecified: Secondary | ICD-10-CM | POA: Diagnosis not present

## 2016-12-14 DIAGNOSIS — F0391 Unspecified dementia with behavioral disturbance: Secondary | ICD-10-CM | POA: Diagnosis not present

## 2016-12-14 DIAGNOSIS — R0981 Nasal congestion: Secondary | ICD-10-CM | POA: Diagnosis not present

## 2016-12-14 DIAGNOSIS — J189 Pneumonia, unspecified organism: Secondary | ICD-10-CM | POA: Diagnosis not present

## 2016-12-14 DIAGNOSIS — E039 Hypothyroidism, unspecified: Secondary | ICD-10-CM | POA: Diagnosis not present

## 2016-12-14 DIAGNOSIS — R54 Age-related physical debility: Secondary | ICD-10-CM | POA: Diagnosis not present

## 2016-12-15 DIAGNOSIS — R0689 Other abnormalities of breathing: Secondary | ICD-10-CM | POA: Diagnosis not present

## 2016-12-19 ENCOUNTER — Emergency Department
Admission: EM | Admit: 2016-12-19 | Discharge: 2016-12-19 | Disposition: A | Discharge status: Home / Self Care | Payer: Medicare HMO | Attending: Emergency Medicine | Admitting: Emergency Medicine

## 2016-12-19 ENCOUNTER — Emergency Department: Payer: Medicare HMO

## 2016-12-19 ENCOUNTER — Encounter: Payer: Self-pay | Admitting: Emergency Medicine

## 2016-12-19 DIAGNOSIS — G3184 Mild cognitive impairment, so stated: Secondary | ICD-10-CM | POA: Diagnosis not present

## 2016-12-19 DIAGNOSIS — Z79899 Other long term (current) drug therapy: Secondary | ICD-10-CM | POA: Diagnosis not present

## 2016-12-19 DIAGNOSIS — E860 Dehydration: Secondary | ICD-10-CM | POA: Insufficient documentation

## 2016-12-19 DIAGNOSIS — I10 Essential (primary) hypertension: Secondary | ICD-10-CM | POA: Insufficient documentation

## 2016-12-19 DIAGNOSIS — J209 Acute bronchitis, unspecified: Secondary | ICD-10-CM | POA: Diagnosis not present

## 2016-12-19 DIAGNOSIS — R531 Weakness: Secondary | ICD-10-CM

## 2016-12-19 DIAGNOSIS — J189 Pneumonia, unspecified organism: Secondary | ICD-10-CM | POA: Diagnosis not present

## 2016-12-19 DIAGNOSIS — E039 Hypothyroidism, unspecified: Secondary | ICD-10-CM | POA: Diagnosis not present

## 2016-12-19 DIAGNOSIS — Z87891 Personal history of nicotine dependence: Secondary | ICD-10-CM | POA: Diagnosis not present

## 2016-12-19 DIAGNOSIS — J449 Chronic obstructive pulmonary disease, unspecified: Secondary | ICD-10-CM | POA: Diagnosis not present

## 2016-12-19 DIAGNOSIS — R6889 Other general symptoms and signs: Secondary | ICD-10-CM | POA: Diagnosis not present

## 2016-12-19 DIAGNOSIS — R54 Age-related physical debility: Secondary | ICD-10-CM | POA: Diagnosis not present

## 2016-12-19 DIAGNOSIS — R0981 Nasal congestion: Secondary | ICD-10-CM | POA: Diagnosis not present

## 2016-12-19 DIAGNOSIS — Z9989 Dependence on other enabling machines and devices: Secondary | ICD-10-CM | POA: Diagnosis not present

## 2016-12-19 LAB — CBC
HCT: 42 % (ref 40.0–52.0)
Hemoglobin: 14.5 g/dL (ref 13.0–18.0)
MCH: 30 pg (ref 26.0–34.0)
MCHC: 34.5 g/dL (ref 32.0–36.0)
MCV: 86.9 fL (ref 80.0–100.0)
Platelets: 154 10*3/uL (ref 150–440)
RBC: 4.83 MIL/uL (ref 4.40–5.90)
RDW: 14.8 % — ABNORMAL HIGH (ref 11.5–14.5)
WBC: 9.6 10*3/uL (ref 3.8–10.6)

## 2016-12-19 LAB — INFLUENZA PANEL BY PCR (TYPE A & B)
INFLBPCR: NEGATIVE
Influenza A By PCR: NEGATIVE

## 2016-12-19 LAB — COMPREHENSIVE METABOLIC PANEL
ALT: 16 U/L — AB (ref 17–63)
ANION GAP: 10 (ref 5–15)
AST: 23 U/L (ref 15–41)
Albumin: 3.8 g/dL (ref 3.5–5.0)
Alkaline Phosphatase: 67 U/L (ref 38–126)
BUN: 31 mg/dL — ABNORMAL HIGH (ref 6–20)
CALCIUM: 9 mg/dL (ref 8.9–10.3)
CHLORIDE: 105 mmol/L (ref 101–111)
CO2: 24 mmol/L (ref 22–32)
CREATININE: 1.59 mg/dL — AB (ref 0.61–1.24)
GFR, EST AFRICAN AMERICAN: 41 mL/min — AB (ref >60)
GFR, EST NON AFRICAN AMERICAN: 35 mL/min — AB (ref >60)
Glucose, Bld: 104 mg/dL — ABNORMAL HIGH (ref 65–99)
Potassium: 3.2 mmol/L — ABNORMAL LOW (ref 3.5–5.1)
SODIUM: 139 mmol/L (ref 135–145)
Total Bilirubin: 2.1 mg/dL — ABNORMAL HIGH (ref 0.3–1.2)
Total Protein: 6.8 g/dL (ref 6.5–8.1)

## 2016-12-19 LAB — TROPONIN I: Troponin I: 0.03 ng/mL (ref <0.03)

## 2016-12-19 MED ORDER — SODIUM CHLORIDE 0.9 % IV SOLN
1000.0000 mL | Freq: Once | INTRAVENOUS | Status: AC
  Administered 2016-12-19: 1000 mL via INTRAVENOUS

## 2016-12-19 NOTE — ED Triage Notes (Signed)
Pt via ems from [facility]. He was sent for weakness. Denies n/v/d/cough/cold symptoms. Pt was seen here one week ago for diarrhea & weakness. Pt alert; has some dementia and is HOH. Smiling and interactive. NAD noted.

## 2016-12-19 NOTE — ED Provider Notes (Signed)
[facility] Emergency Department Provider Note   ____________________________________________    I have reviewed the triage vital signs and the nursing notes.   HISTORY  Chief Complaint Weakness  Patient with Alzheimer's dementia, history provided by daughter   HPI Aaron Ellis is a 83 y.o. male who presents with complaints ofweakness nursing home. Reportedly the patient is able to ability with cane at baseline however over the last week he has been much weaker and unable to ability. He has had a cough and was treated for bronchitis but apparently there was a delay in antibiotics as well. Family is concerned that he is dehydrated   Past Medical History:  Diagnosis Date  . Hypertension     Patient Active Problem List   Diagnosis Date Noted  . Protein-calorie malnutrition, severe 04/16/2016  . Subdural hematoma ([place]) 04/14/2016    Past Surgical History:  Procedure Laterality Date  . CATARACT EXTRACTION      Prior to Admission medications   Medication Sig Start Date End Date Taking? Authorizing Provider  albuterol (PROVENTIL HFA;VENTOLIN HFA) 108 (90 Base) MCG/ACT inhaler Inhale 2 puffs into the lungs every 6 (six) hours as needed for wheezing or shortness of breath. 12/10/16   Carrie Mew, MD  alum & mag hydroxide-simeth (MAALOX PLUS) 400-400-40 MG/5ML suspension Take 30 mLs by mouth every 6 (six) hours as needed for indigestion.    Historical Provider, MD  doxycycline (VIBRA-TABS) 100 MG tablet Take 1 tablet (100 mg total) by mouth 2 (two) times daily. 12/10/16   Carrie Mew, MD  escitalopram (LEXAPRO) 10 MG tablet Take 10 mg by mouth every morning.    Historical Provider, MD  finasteride (PROSCAR) 5 MG tablet Take 5 mg by mouth daily.    Historical Provider, MD  folic acid (FOLVITE) 1 MG tablet Take 1 mg by mouth daily.    Historical Provider, MD  guaiFENesin (ROBITUSSIN) 100 MG/5ML SOLN Take 10 mLs by mouth every 6 (six) hours as  needed for cough.    Historical Provider, MD  levothyroxine (SYNTHROID, LEVOTHROID) 75 MCG tablet Take 75 mcg by mouth daily before breakfast.    Historical Provider, MD  loperamide (IMODIUM) 2 MG capsule Take 2 mg by mouth as needed for diarrhea or loose stools.    Historical Provider, MD  magnesium hydroxide (MILK OF MAGNESIA) 400 MG/5ML suspension Take 30 mLs by mouth at bedtime as needed for mild constipation.    Historical Provider, MD  neomycin-bacitracin-polymyxin (NEOSPORIN) [phone] ointment Apply 1 application topically 2 (two) times daily. Apply to great right toe nailbed    Historical Provider, MD  NIFEdipine (PROCARDIA-XL/ADALAT-CC/NIFEDICAL-XL) 30 MG 24 hr tablet Take 30 mg by mouth daily.    Historical Provider, MD  predniSONE (DELTASONE) 20 MG tablet Take 2 tablets (40 mg total) by mouth daily. 12/10/16 12/10/17  Carrie Mew, MD  QUEtiapine (SEROQUEL) 25 MG tablet Take 1 tablet (25 mg total) by mouth every 8 (eight) hours as needed (agitation). 04/18/16   Dustin Flock, MD  tamsulosin (FLOMAX) 0.4 MG CAPS capsule Take 0.4 mg by mouth daily.    Historical Provider, MD  triamcinolone cream (KENALOG) 0.1 % Apply 1 application topically 2 (two) times daily. Apply to facial and neck rash daily at 0800 and 2000    Historical Provider, MD     Allergies Corn starch and Corn-containing products  Family History  Problem Relation Age of Onset  . Hypertension Other     Social History Social History  Substance  Use Topics  . Smoking status: Former Research scientist (life sciences)  . Smokeless tobacco: Never Used  . Alcohol use Not on file    L5 caveat: Unable to obtain Review of Systems due to dementia     ____________________________________________   PHYSICAL EXAM:  VITAL SIGNS: ED Triage Vitals [12/19/16 1222]  Enc Vitals Group     BP (!) 142/90     Pulse Rate 82     Resp      Temp 97.9 F (36.6 C)     Temp Source Oral     SpO2 94 %     Weight 160 lb (72.6 kg)     Height 5\' 10"   (1.778 m)     Head Circumference      Peak Flow      Pain Score      Pain Loc      Pain Edu?      Excl. in [place]?     Constitutional: Alert , No acute distress Eyes: Conjunctivae are normal.  Head: Atraumatic. Nose: No congestion/rhinnorhea. Mouth/Throat: Mucous membranes are dry  Cardiovascular: Normal rate, regular rhythm. Grossly normal heart sounds.  Good peripheral circulation. Respiratory: Normal respiratory effort.  No retractions. Lungs CTAB. Gastrointestinal: Soft and nontender. No distention.  No CVA tenderness. Genitourinary: deferred Musculoskeletal:  Warm and well perfused Neurologic:  Normal speech and language. No gross focal neurologic deficits are appreciated.  Skin:  Skin is warm, dry and intact. No rash noted. Psychiatric: Mood and affect are normal. Speech and behavior are normal.  ____________________________________________   LABS (all labs ordered are listed, but only abnormal results are displayed)  Labs Reviewed  CBC - Abnormal; Notable for the following:       Result Value   RDW 14.8 (*)    All other components within normal limits  COMPREHENSIVE METABOLIC PANEL - Abnormal; Notable for the following:    Potassium 3.2 (*)    Glucose, Bld 104 (*)    BUN 31 (*)    Creatinine, Ser 1.59 (*)    ALT 16 (*)    Total Bilirubin 2.1 (*)    GFR calc non Af Amer 35 (*)    GFR calc Af Amer 41 (*)    All other components within normal limits  TROPONIN I - Abnormal; Notable for the following:    Troponin I 0.03 (*)    All other components within normal limits  INFLUENZA PANEL BY PCR (TYPE A & B)  URINALYSIS, COMPLETE (UACMP) WITH MICROSCOPIC  CBG MONITORING, ED   ____________________________________________  EKG  ED ECG REPORT I, Lavonia Drafts, the attending physician, personally viewed and interpreted this ECG.  Date: 12/19/2016 EKG Time: 12:38 PM Rate: 82 Rhythm: Atrial fibrillation QRS Axis: normal Intervals: normal ST/T Wave abnormalities:  Nonspecific changes Conduction Disturbances: none   ____________________________________________  RADIOLOGY  Chest x-ray pending ____________________________________________   PROCEDURES  Procedure(s) performed: No    Critical Care performed: No ____________________________________________   INITIAL IMPRESSION / ASSESSMENT AND PLAN / ED COURSE  Pertinent labs & imaging results that were available during my care of the patient were reviewed by me and considered in my medical decision making (see chart for details).  Patient presents with reported weakness/possible dehydration. Labs do support mild dehydration, IV fluids infusing.  ----------------------------------------- 3:30 PM on 12/19/2016 -----------------------------------------  Patient's lab work is overall reassuring. His vital signs are unremarkable. I had a long discussion with daughter and she understands that he is likely approaching end-of-life gradually and that his slowly  worsening weakness and decreased by mouth intake is likely part of this.  After fluids we will discharge the patient back to nursing home, daughter agrees with this plan ____________________________________________   FINAL CLINICAL IMPRESSION(S) / ED DIAGNOSES  Final diagnoses:  Dehydration  Weakness      NEW MEDICATIONS STARTED DURING THIS VISIT:  New Prescriptions   No medications on file     Note:  This document was prepared using Dragon voice recognition software and may include unintentional dictation errors.    Lavonia Drafts, MD 12/19/16 [phone]

## 2016-12-19 NOTE — Discharge Instructions (Signed)
[facility]: Please encourage hydration frequently throughout the day!

## 2016-12-21 DIAGNOSIS — H9190 Unspecified hearing loss, unspecified ear: Secondary | ICD-10-CM | POA: Diagnosis not present

## 2016-12-21 DIAGNOSIS — E039 Hypothyroidism, unspecified: Secondary | ICD-10-CM | POA: Diagnosis not present

## 2016-12-21 DIAGNOSIS — E860 Dehydration: Secondary | ICD-10-CM | POA: Diagnosis not present

## 2016-12-21 DIAGNOSIS — R531 Weakness: Secondary | ICD-10-CM | POA: Diagnosis not present

## 2016-12-21 DIAGNOSIS — J449 Chronic obstructive pulmonary disease, unspecified: Secondary | ICD-10-CM | POA: Diagnosis not present

## 2016-12-21 DIAGNOSIS — I10 Essential (primary) hypertension: Secondary | ICD-10-CM | POA: Diagnosis not present

## 2016-12-21 DIAGNOSIS — J189 Pneumonia, unspecified organism: Secondary | ICD-10-CM | POA: Diagnosis not present

## 2016-12-21 DIAGNOSIS — R262 Difficulty in walking, not elsewhere classified: Secondary | ICD-10-CM | POA: Diagnosis not present

## 2016-12-26 DIAGNOSIS — H9190 Unspecified hearing loss, unspecified ear: Secondary | ICD-10-CM | POA: Diagnosis not present

## 2016-12-26 DIAGNOSIS — I10 Essential (primary) hypertension: Secondary | ICD-10-CM | POA: Diagnosis not present

## 2016-12-26 DIAGNOSIS — J449 Chronic obstructive pulmonary disease, unspecified: Secondary | ICD-10-CM | POA: Diagnosis not present

## 2016-12-26 DIAGNOSIS — J189 Pneumonia, unspecified organism: Secondary | ICD-10-CM | POA: Diagnosis not present

## 2016-12-26 DIAGNOSIS — R54 Age-related physical debility: Secondary | ICD-10-CM | POA: Diagnosis not present

## 2016-12-26 DIAGNOSIS — E039 Hypothyroidism, unspecified: Secondary | ICD-10-CM | POA: Diagnosis not present

## 2016-12-26 DIAGNOSIS — W19XXXS Unspecified fall, sequela: Secondary | ICD-10-CM | POA: Diagnosis not present

## 2016-12-26 DIAGNOSIS — R531 Weakness: Secondary | ICD-10-CM | POA: Diagnosis not present

## 2016-12-28 DIAGNOSIS — E039 Hypothyroidism, unspecified: Secondary | ICD-10-CM | POA: Diagnosis not present

## 2016-12-28 DIAGNOSIS — F339 Major depressive disorder, recurrent, unspecified: Secondary | ICD-10-CM | POA: Diagnosis not present

## 2016-12-28 DIAGNOSIS — J189 Pneumonia, unspecified organism: Secondary | ICD-10-CM | POA: Diagnosis not present

## 2016-12-28 DIAGNOSIS — H9190 Unspecified hearing loss, unspecified ear: Secondary | ICD-10-CM | POA: Diagnosis not present

## 2016-12-28 DIAGNOSIS — Z79899 Other long term (current) drug therapy: Secondary | ICD-10-CM | POA: Diagnosis not present

## 2016-12-28 DIAGNOSIS — J449 Chronic obstructive pulmonary disease, unspecified: Secondary | ICD-10-CM | POA: Diagnosis not present

## 2016-12-28 DIAGNOSIS — I10 Essential (primary) hypertension: Secondary | ICD-10-CM | POA: Diagnosis not present

## 2016-12-28 DIAGNOSIS — R531 Weakness: Secondary | ICD-10-CM | POA: Diagnosis not present

## 2017-01-03 DIAGNOSIS — M6281 Muscle weakness (generalized): Secondary | ICD-10-CM | POA: Diagnosis not present

## 2017-01-03 DIAGNOSIS — R293 Abnormal posture: Secondary | ICD-10-CM | POA: Diagnosis not present

## 2017-01-03 DIAGNOSIS — R2681 Unsteadiness on feet: Secondary | ICD-10-CM | POA: Diagnosis not present

## 2017-01-04 DIAGNOSIS — R54 Age-related physical debility: Secondary | ICD-10-CM | POA: Diagnosis not present

## 2017-01-04 DIAGNOSIS — H9190 Unspecified hearing loss, unspecified ear: Secondary | ICD-10-CM | POA: Diagnosis not present

## 2017-01-04 DIAGNOSIS — E039 Hypothyroidism, unspecified: Secondary | ICD-10-CM | POA: Diagnosis not present

## 2017-01-04 DIAGNOSIS — R531 Weakness: Secondary | ICD-10-CM | POA: Diagnosis not present

## 2017-01-04 DIAGNOSIS — J189 Pneumonia, unspecified organism: Secondary | ICD-10-CM | POA: Diagnosis not present

## 2017-01-04 DIAGNOSIS — R262 Difficulty in walking, not elsewhere classified: Secondary | ICD-10-CM | POA: Diagnosis not present

## 2017-01-04 DIAGNOSIS — I10 Essential (primary) hypertension: Secondary | ICD-10-CM | POA: Diagnosis not present

## 2017-01-04 DIAGNOSIS — F339 Major depressive disorder, recurrent, unspecified: Secondary | ICD-10-CM | POA: Diagnosis not present

## 2017-01-06 DIAGNOSIS — R293 Abnormal posture: Secondary | ICD-10-CM | POA: Diagnosis not present

## 2017-01-06 DIAGNOSIS — M6281 Muscle weakness (generalized): Secondary | ICD-10-CM | POA: Diagnosis not present

## 2017-01-06 DIAGNOSIS — R2681 Unsteadiness on feet: Secondary | ICD-10-CM | POA: Diagnosis not present

## 2017-01-11 DIAGNOSIS — R2681 Unsteadiness on feet: Secondary | ICD-10-CM | POA: Diagnosis not present

## 2017-01-11 DIAGNOSIS — R293 Abnormal posture: Secondary | ICD-10-CM | POA: Diagnosis not present

## 2017-01-11 DIAGNOSIS — M6281 Muscle weakness (generalized): Secondary | ICD-10-CM | POA: Diagnosis not present

## 2017-01-16 ENCOUNTER — Emergency Department
Admission: EM | Admit: 2017-01-16 | Discharge: 2017-01-16 | Disposition: A | Discharge status: Home / Self Care | Payer: Medicare HMO | Attending: Emergency Medicine | Admitting: Emergency Medicine

## 2017-01-16 ENCOUNTER — Encounter: Payer: Self-pay | Admitting: Emergency Medicine

## 2017-01-16 DIAGNOSIS — I10 Essential (primary) hypertension: Secondary | ICD-10-CM | POA: Diagnosis not present

## 2017-01-16 DIAGNOSIS — Z87891 Personal history of nicotine dependence: Secondary | ICD-10-CM | POA: Diagnosis not present

## 2017-01-16 DIAGNOSIS — Z79899 Other long term (current) drug therapy: Secondary | ICD-10-CM | POA: Insufficient documentation

## 2017-01-16 DIAGNOSIS — R531 Weakness: Secondary | ICD-10-CM | POA: Diagnosis not present

## 2017-01-16 DIAGNOSIS — E039 Hypothyroidism, unspecified: Secondary | ICD-10-CM | POA: Diagnosis not present

## 2017-01-16 DIAGNOSIS — I4891 Unspecified atrial fibrillation: Secondary | ICD-10-CM | POA: Diagnosis not present

## 2017-01-16 DIAGNOSIS — F339 Major depressive disorder, recurrent, unspecified: Secondary | ICD-10-CM | POA: Diagnosis not present

## 2017-01-16 DIAGNOSIS — R001 Bradycardia, unspecified: Secondary | ICD-10-CM | POA: Insufficient documentation

## 2017-01-16 DIAGNOSIS — H9190 Unspecified hearing loss, unspecified ear: Secondary | ICD-10-CM | POA: Diagnosis not present

## 2017-01-16 DIAGNOSIS — I499 Cardiac arrhythmia, unspecified: Secondary | ICD-10-CM | POA: Diagnosis not present

## 2017-01-16 DIAGNOSIS — R54 Age-related physical debility: Secondary | ICD-10-CM | POA: Diagnosis not present

## 2017-01-16 LAB — COMPREHENSIVE METABOLIC PANEL
ALBUMIN: 4 g/dL (ref 3.5–5.0)
ALT: 11 U/L — ABNORMAL LOW (ref 17–63)
ANION GAP: 6 (ref 5–15)
AST: 22 U/L (ref 15–41)
Alkaline Phosphatase: 77 U/L (ref 38–126)
BILIRUBIN TOTAL: 0.4 mg/dL (ref 0.3–1.2)
BUN: 13 mg/dL (ref 6–20)
CO2: 26 mmol/L (ref 22–32)
Calcium: 8.8 mg/dL — ABNORMAL LOW (ref 8.9–10.3)
Chloride: 106 mmol/L (ref 101–111)
Creatinine, Ser: 1.18 mg/dL (ref 0.61–1.24)
GFR calc Af Amer: 59 mL/min — ABNORMAL LOW (ref >60)
GFR calc non Af Amer: 51 mL/min — ABNORMAL LOW (ref >60)
GLUCOSE: 124 mg/dL — AB (ref 65–99)
POTASSIUM: 4 mmol/L (ref 3.5–5.1)
SODIUM: 138 mmol/L (ref 135–145)
TOTAL PROTEIN: 7 g/dL (ref 6.5–8.1)

## 2017-01-16 LAB — CBC
HCT: 40.6 % (ref 40.0–52.0)
Hemoglobin: 13.7 g/dL (ref 13.0–18.0)
MCH: 30 pg (ref 26.0–34.0)
MCHC: 33.7 g/dL (ref 32.0–36.0)
MCV: 89.1 fL (ref 80.0–100.0)
Platelets: 172 10*3/uL (ref 150–440)
RBC: 4.56 MIL/uL (ref 4.40–5.90)
RDW: 15.8 % — AB (ref 11.5–14.5)
WBC: 6.3 10*3/uL (ref 3.8–10.6)

## 2017-01-16 LAB — TROPONIN I: Troponin I: <0.03 ng/mL (ref <0.03)

## 2017-01-16 NOTE — ED Notes (Signed)
Daughter out of room asking for status and disposition.

## 2017-01-16 NOTE — ED Notes (Signed)
Daughter states that pt has DNR and living will but she does not have them with her. Advised her that they are not in his chart and that he should always have DNR with him. No DNR in paperwork from facility

## 2017-01-16 NOTE — ED Notes (Signed)
Pt discharged home after verbalizing understanding of discharge instructions; nad noted. 

## 2017-01-16 NOTE — ED Provider Notes (Signed)
[facility] Emergency Department Provider Note   ____________________________________________    I have reviewed the triage vital signs and the nursing notes.   HISTORY  Chief Complaint Bradycardia  History limited by dementia   HPI Aaron Ellis is a 83 y.o. male who presents with reports of bradycardia. EMS notes that the patient was sent from [place] for reported heart rates in the 30s. Patient has no complaints but dementia limits his ability to describe how he feels   Past Medical History:  Diagnosis Date  . Hypertension     Patient Active Problem List   Diagnosis Date Noted  . Protein-calorie malnutrition, severe 04/16/2016  . Subdural hematoma ([place]) 04/14/2016    Past Surgical History:  Procedure Laterality Date  . CATARACT EXTRACTION      Prior to Admission medications   Medication Sig Start Date End Date Taking? Authorizing Provider  albuterol (PROVENTIL HFA;VENTOLIN HFA) 108 (90 Base) MCG/ACT inhaler Inhale 2 puffs into the lungs every 6 (six) hours as needed for wheezing or shortness of breath. 12/10/16  Yes Carrie Mew, MD  alum & mag hydroxide-simeth (MAALOX PLUS) 400-400-40 MG/5ML suspension Take 30 mLs by mouth every 6 (six) hours as needed for indigestion.   Yes Historical Provider, MD  escitalopram (LEXAPRO) 10 MG tablet Take 10 mg by mouth every morning.   Yes Historical Provider, MD  finasteride (PROSCAR) 5 MG tablet Take 5 mg by mouth daily.   Yes Historical Provider, MD  folic acid (FOLVITE) 1 MG tablet Take 1 mg by mouth daily.   Yes Historical Provider, MD  guaiFENesin (ROBITUSSIN) 100 MG/5ML SOLN Take 10 mLs by mouth every 6 (six) hours as needed for cough.   Yes Historical Provider, MD  levothyroxine (SYNTHROID, LEVOTHROID) 75 MCG tablet Take 75 mcg by mouth daily before breakfast.   Yes Historical Provider, MD  loperamide (IMODIUM) 2 MG capsule Take 2 mg by mouth as needed for diarrhea or loose stools.    Yes Historical Provider, MD  magnesium hydroxide (MILK OF MAGNESIA) 400 MG/5ML suspension Take 30 mLs by mouth at bedtime as needed for mild constipation.   Yes Historical Provider, MD  neomycin-bacitracin-polymyxin (NEOSPORIN) [phone] ointment Apply 1 application topically 2 (two) times daily. Apply to great right toe nailbed   Yes Historical Provider, MD  triamcinolone cream (KENALOG) 0.1 % Apply 1 application topically 2 (two) times daily. Apply to facial and neck rash daily at 0800 and 2000   Yes Historical Provider, MD  doxycycline (VIBRA-TABS) 100 MG tablet Take 1 tablet (100 mg total) by mouth 2 (two) times daily. Patient not taking: Reported on 01/16/2017 12/10/16   Carrie Mew, MD  NIFEdipine (PROCARDIA-XL/ADALAT-CC/NIFEDICAL-XL) 30 MG 24 hr tablet Take 30 mg by mouth daily.    Historical Provider, MD  predniSONE (DELTASONE) 20 MG tablet Take 2 tablets (40 mg total) by mouth daily. Patient not taking: Reported on 01/16/2017 12/10/16 12/10/17  Carrie Mew, MD  QUEtiapine (SEROQUEL) 25 MG tablet Take 1 tablet (25 mg total) by mouth every 8 (eight) hours as needed (agitation). Patient not taking: Reported on 01/16/2017 04/18/16   Dustin Flock, MD  tamsulosin (FLOMAX) 0.4 MG CAPS capsule Take 0.4 mg by mouth daily.    Historical Provider, MD     Allergies Corn starch and Corn-containing products  Family History  Problem Relation Age of Onset  . Hypertension Other     Social History Social History  Substance Use Topics  . Smoking status: Former Research scientist (life sciences)  .  Smokeless tobacco: Never Used  . Alcohol use No    L5 caveat: Unable to obtain Review of Systems due to dementia    ____________________________________________   PHYSICAL EXAM:  VITAL SIGNS: ED Triage Vitals  Enc Vitals Group     BP 01/16/17 0955 139/65     Pulse Rate 01/16/17 0955 89     Resp 01/16/17 0955 15     Temp 01/16/17 0955 97.5 F (36.4 C)     Temp Source 01/16/17 0955 Oral     SpO2 01/16/17 0955  98 %     Weight 01/16/17 0959 160 lb (72.6 kg)     Height 01/16/17 0959 5\' 10"  (1.778 m)     Head Circumference --      Peak Flow --      Pain Score 01/16/17 1003 0     Pain Loc --      Pain Edu? --      Excl. in [place]? --     Constitutional: Alert. No acute distress. Pleasant and interactive Eyes: Conjunctivae are normal.   Nose: No congestion/rhinnorhea. Mouth/Throat: Mucous membranes are moist.    Cardiovascular: Normal rate,Irregularly irregular rhythm. Grossly normal heart sounds.  Good peripheral circulation. Respiratory: Normal respiratory effort.  No retractions. Lungs CTAB. Gastrointestinal: Soft and nontender. No distention.  No CVA tenderness. Genitourinary: deferred Musculoskeletal: No lower extremity tenderness nor edema.  Warm and well perfused Neurologic:  Normal speech and language. No gross focal neurologic deficits are appreciated.  Skin:  Skin is warm, dry and intact. No rash noted.   ____________________________________________   LABS (all labs ordered are listed, but only abnormal results are displayed)  Labs Reviewed  CBC - Abnormal; Notable for the following:       Result Value   RDW 15.8 (*)    All other components within normal limits  COMPREHENSIVE METABOLIC PANEL - Abnormal; Notable for the following:    Glucose, Bld 124 (*)    Calcium 8.8 (*)    ALT 11 (*)    GFR calc non Af Amer 51 (*)    GFR calc Af Amer 59 (*)    All other components within normal limits  TROPONIN I   ____________________________________________  EKG  ED ECG REPORT I, Lavonia Drafts, the attending physician, personally viewed and interpreted this ECG.  Date: 01/16/2017  Rate: 102 Rhythm: atrial fibrillation QRS Axis: normal Intervals: normal ST/T Wave abnormalities: normal Conduction Disturbances: PVCs Narrative Interpretation:  unremarkable  ____________________________________________  RADIOLOGY  None ____________________________________________   PROCEDURES  Procedure(s) performed: No    Critical Care performed: No ____________________________________________   INITIAL IMPRESSION / ASSESSMENT AND PLAN / ED COURSE  Pertinent labs & imaging results that were available during my care of the patient were reviewed by me and considered in my medical decision making (see chart for details).  Patient presents with reports of bradycardia. Upon arrival his heart rate is normal his EKG is unchanged from prior. Patient was observed on the monitor for greater than 2 hours in the emergency department never had a heart rate less than 80. Lab work is unremarkable .He does have frequent PVCs but this is unremarkable for him. Discussed with Dr. Eulogio Bear, she is comfortable with him being discharged she will monitor at the nursing home.    ____________________________________________   FINAL CLINICAL IMPRESSION(S) / ED DIAGNOSES  Final diagnoses:  Vagal bradycardia      NEW MEDICATIONS STARTED DURING THIS VISIT:  Discharge Medication List as of 01/16/2017 12:18  PM       Note:  This document was prepared using Dragon voice recognition software and may include unintentional dictation errors.    Lavonia Drafts, MD 01/16/17 [phone]

## 2017-01-16 NOTE — ED Triage Notes (Signed)
Pt from [facility]; ems called for slow hr. Facility had hr at 70, FD had 40's. EMS showed afib with irreg rate. Pt alert & oriented; no distress noted.

## 2017-01-18 DIAGNOSIS — I499 Cardiac arrhythmia, unspecified: Secondary | ICD-10-CM | POA: Diagnosis not present

## 2017-01-18 DIAGNOSIS — S5012XA Contusion of left forearm, initial encounter: Secondary | ICD-10-CM | POA: Diagnosis not present

## 2017-01-18 DIAGNOSIS — R531 Weakness: Secondary | ICD-10-CM | POA: Diagnosis not present

## 2017-01-18 DIAGNOSIS — G309 Alzheimer's disease, unspecified: Secondary | ICD-10-CM | POA: Diagnosis not present

## 2017-01-18 DIAGNOSIS — I10 Essential (primary) hypertension: Secondary | ICD-10-CM | POA: Diagnosis not present

## 2017-01-18 DIAGNOSIS — R54 Age-related physical debility: Secondary | ICD-10-CM | POA: Diagnosis not present

## 2017-01-18 DIAGNOSIS — H9190 Unspecified hearing loss, unspecified ear: Secondary | ICD-10-CM | POA: Diagnosis not present

## 2017-01-19 DIAGNOSIS — J449 Chronic obstructive pulmonary disease, unspecified: Secondary | ICD-10-CM | POA: Diagnosis not present

## 2017-01-30 DIAGNOSIS — I10 Essential (primary) hypertension: Secondary | ICD-10-CM | POA: Diagnosis not present

## 2017-01-30 DIAGNOSIS — H9190 Unspecified hearing loss, unspecified ear: Secondary | ICD-10-CM | POA: Diagnosis not present

## 2017-01-30 DIAGNOSIS — G309 Alzheimer's disease, unspecified: Secondary | ICD-10-CM | POA: Diagnosis not present

## 2017-01-30 DIAGNOSIS — I499 Cardiac arrhythmia, unspecified: Secondary | ICD-10-CM | POA: Diagnosis not present

## 2017-01-30 DIAGNOSIS — F339 Major depressive disorder, recurrent, unspecified: Secondary | ICD-10-CM | POA: Diagnosis not present

## 2017-02-01 DIAGNOSIS — R5381 Other malaise: Secondary | ICD-10-CM | POA: Diagnosis not present

## 2017-02-01 DIAGNOSIS — E039 Hypothyroidism, unspecified: Secondary | ICD-10-CM | POA: Diagnosis not present

## 2017-02-01 DIAGNOSIS — Z79899 Other long term (current) drug therapy: Secondary | ICD-10-CM | POA: Diagnosis not present

## 2017-02-01 DIAGNOSIS — F329 Major depressive disorder, single episode, unspecified: Secondary | ICD-10-CM | POA: Diagnosis not present

## 2017-02-07 DIAGNOSIS — Z79899 Other long term (current) drug therapy: Secondary | ICD-10-CM | POA: Diagnosis not present

## 2017-02-13 DIAGNOSIS — G309 Alzheimer's disease, unspecified: Secondary | ICD-10-CM | POA: Diagnosis not present

## 2017-02-13 DIAGNOSIS — N390 Urinary tract infection, site not specified: Secondary | ICD-10-CM | POA: Diagnosis not present

## 2017-02-13 DIAGNOSIS — I10 Essential (primary) hypertension: Secondary | ICD-10-CM | POA: Diagnosis not present

## 2017-02-13 DIAGNOSIS — F339 Major depressive disorder, recurrent, unspecified: Secondary | ICD-10-CM | POA: Diagnosis not present

## 2017-02-13 DIAGNOSIS — H9190 Unspecified hearing loss, unspecified ear: Secondary | ICD-10-CM | POA: Diagnosis not present

## 2017-02-16 DIAGNOSIS — J449 Chronic obstructive pulmonary disease, unspecified: Secondary | ICD-10-CM | POA: Diagnosis not present

## 2017-02-20 DIAGNOSIS — G309 Alzheimer's disease, unspecified: Secondary | ICD-10-CM | POA: Diagnosis not present

## 2017-02-20 DIAGNOSIS — I499 Cardiac arrhythmia, unspecified: Secondary | ICD-10-CM | POA: Diagnosis not present

## 2017-02-20 DIAGNOSIS — H9190 Unspecified hearing loss, unspecified ear: Secondary | ICD-10-CM | POA: Diagnosis not present

## 2017-02-20 DIAGNOSIS — I10 Essential (primary) hypertension: Secondary | ICD-10-CM | POA: Diagnosis not present

## 2017-02-20 DIAGNOSIS — R001 Bradycardia, unspecified: Secondary | ICD-10-CM | POA: Diagnosis not present

## 2017-02-20 DIAGNOSIS — F339 Major depressive disorder, recurrent, unspecified: Secondary | ICD-10-CM | POA: Diagnosis not present

## 2017-02-25 ENCOUNTER — Emergency Department
Admission: EM | Admit: 2017-02-25 | Discharge: 2017-02-25 | Disposition: A | Discharge status: Home / Self Care | Payer: Medicare HMO | Attending: Emergency Medicine | Admitting: Emergency Medicine

## 2017-02-25 ENCOUNTER — Emergency Department: Payer: Medicare HMO

## 2017-02-25 ENCOUNTER — Encounter: Payer: Self-pay | Admitting: Emergency Medicine

## 2017-02-25 DIAGNOSIS — Y92239 Unspecified place in hospital as the place of occurrence of the external cause: Secondary | ICD-10-CM | POA: Insufficient documentation

## 2017-02-25 DIAGNOSIS — S299XXA Unspecified injury of thorax, initial encounter: Secondary | ICD-10-CM | POA: Diagnosis not present

## 2017-02-25 DIAGNOSIS — S0083XA Contusion of other part of head, initial encounter: Secondary | ICD-10-CM | POA: Diagnosis not present

## 2017-02-25 DIAGNOSIS — Z23 Encounter for immunization: Secondary | ICD-10-CM | POA: Diagnosis not present

## 2017-02-25 DIAGNOSIS — I10 Essential (primary) hypertension: Secondary | ICD-10-CM | POA: Insufficient documentation

## 2017-02-25 DIAGNOSIS — W010XXA Fall on same level from slipping, tripping and stumbling without subsequent striking against object, initial encounter: Secondary | ICD-10-CM | POA: Diagnosis not present

## 2017-02-25 DIAGNOSIS — S51812A Laceration without foreign body of left forearm, initial encounter: Secondary | ICD-10-CM | POA: Diagnosis not present

## 2017-02-25 DIAGNOSIS — Y9301 Activity, walking, marching and hiking: Secondary | ICD-10-CM | POA: Insufficient documentation

## 2017-02-25 DIAGNOSIS — W19XXXA Unspecified fall, initial encounter: Secondary | ICD-10-CM | POA: Diagnosis not present

## 2017-02-25 DIAGNOSIS — S51012A Laceration without foreign body of left elbow, initial encounter: Secondary | ICD-10-CM | POA: Diagnosis not present

## 2017-02-25 DIAGNOSIS — S0990XA Unspecified injury of head, initial encounter: Secondary | ICD-10-CM | POA: Diagnosis not present

## 2017-02-25 DIAGNOSIS — Z79899 Other long term (current) drug therapy: Secondary | ICD-10-CM | POA: Diagnosis not present

## 2017-02-25 DIAGNOSIS — Z87891 Personal history of nicotine dependence: Secondary | ICD-10-CM | POA: Diagnosis not present

## 2017-02-25 DIAGNOSIS — S199XXA Unspecified injury of neck, initial encounter: Secondary | ICD-10-CM | POA: Diagnosis not present

## 2017-02-25 DIAGNOSIS — Y999 Unspecified external cause status: Secondary | ICD-10-CM | POA: Diagnosis not present

## 2017-02-25 DIAGNOSIS — S51802A Unspecified open wound of left forearm, initial encounter: Secondary | ICD-10-CM | POA: Diagnosis not present

## 2017-02-25 DIAGNOSIS — R410 Disorientation, unspecified: Secondary | ICD-10-CM | POA: Diagnosis not present

## 2017-02-25 DIAGNOSIS — S59912A Unspecified injury of left forearm, initial encounter: Secondary | ICD-10-CM | POA: Diagnosis not present

## 2017-02-25 DIAGNOSIS — S79912A Unspecified injury of left hip, initial encounter: Secondary | ICD-10-CM | POA: Diagnosis not present

## 2017-02-25 DIAGNOSIS — M25522 Pain in left elbow: Secondary | ICD-10-CM | POA: Diagnosis not present

## 2017-02-25 DIAGNOSIS — W1830XA Fall on same level, unspecified, initial encounter: Secondary | ICD-10-CM | POA: Diagnosis not present

## 2017-02-25 DIAGNOSIS — M79632 Pain in left forearm: Secondary | ICD-10-CM | POA: Diagnosis not present

## 2017-02-25 DIAGNOSIS — S59902A Unspecified injury of left elbow, initial encounter: Secondary | ICD-10-CM | POA: Diagnosis not present

## 2017-02-25 HISTORY — DX: Restlessness and agitation: R45.1

## 2017-02-25 HISTORY — DX: Benign neoplasm of prostate: D29.1

## 2017-02-25 LAB — CBC WITH DIFFERENTIAL/PLATELET
BASOS ABS: 0 10*3/uL (ref 0–0.1)
Basophils Relative: 0 %
Eosinophils Absolute: 0 10*3/uL (ref 0–0.7)
Eosinophils Relative: 0 %
HEMATOCRIT: 36.4 % — AB (ref 40.0–52.0)
HEMOGLOBIN: 12.4 g/dL — AB (ref 13.0–18.0)
LYMPHS PCT: 9 %
Lymphs Abs: 0.6 10*3/uL — ABNORMAL LOW (ref 1.0–3.6)
MCH: 30.3 pg (ref 26.0–34.0)
MCHC: 34 g/dL (ref 32.0–36.0)
MCV: 89.1 fL (ref 80.0–100.0)
MONO ABS: 0.7 10*3/uL (ref 0.2–1.0)
MONOS PCT: 10 %
NEUTROS ABS: 5.6 10*3/uL (ref 1.4–6.5)
NEUTROS PCT: 81 %
Platelets: 137 10*3/uL — ABNORMAL LOW (ref 150–440)
RBC: 4.09 MIL/uL — ABNORMAL LOW (ref 4.40–5.90)
RDW: 15.6 % — AB (ref 11.5–14.5)
WBC: 6.9 10*3/uL (ref 3.8–10.6)

## 2017-02-25 LAB — COMPREHENSIVE METABOLIC PANEL
ALBUMIN: 3.7 g/dL (ref 3.5–5.0)
ALT: 10 U/L — ABNORMAL LOW (ref 17–63)
AST: 23 U/L (ref 15–41)
Alkaline Phosphatase: 74 U/L (ref 38–126)
Anion gap: 9 (ref 5–15)
BUN: 15 mg/dL (ref 6–20)
CHLORIDE: 104 mmol/L (ref 101–111)
CO2: 23 mmol/L (ref 22–32)
Calcium: 8.3 mg/dL — ABNORMAL LOW (ref 8.9–10.3)
Creatinine, Ser: 1.14 mg/dL (ref 0.61–1.24)
GFR calc Af Amer: >60 mL/min (ref >60)
GFR calc non Af Amer: 53 mL/min — ABNORMAL LOW (ref >60)
GLUCOSE: 123 mg/dL — AB (ref 65–99)
POTASSIUM: 3.2 mmol/L — AB (ref 3.5–5.1)
Sodium: 136 mmol/L (ref 135–145)
Total Bilirubin: 1.3 mg/dL — ABNORMAL HIGH (ref 0.3–1.2)
Total Protein: 6.7 g/dL (ref 6.5–8.1)

## 2017-02-25 LAB — CK: Total CK: 55 U/L (ref 49–397)

## 2017-02-25 LAB — TROPONIN I: Troponin I: <0.03 ng/mL (ref <0.03)

## 2017-02-25 MED ORDER — TETANUS-DIPHTH-ACELL PERTUSSIS 5-2.5-18.5 LF-MCG/0.5 IM SUSP
0.5000 mL | Freq: Once | INTRAMUSCULAR | Status: AC
  Administered 2017-02-25: 0.5 mL via INTRAMUSCULAR
  Filled 2017-02-25: qty 0.5

## 2017-02-25 NOTE — ED Provider Notes (Signed)
[facility] Emergency Department Provider Note  ____________________________________________   First MD Initiated Contact with Patient 02/25/17 2040     (approximate)  I have reviewed the triage vital signs and the nursing notes.   HISTORY  Chief Complaint Fall  History is limited by the patient's dementia  HPI Aaron Ellis is a 83 y.o. male who comes to the emergency department via EMS after an unwitnessed fall.He does not remember what happened and there are no witnesses. He does have a reported laceration to the dorsal aspect of his left forearm. Further history is limited by the patient's dementia.   Past Medical History:  Diagnosis Date  . Agitation   . Benign neoplasm of prostate   . Hypertension     Patient Active Problem List   Diagnosis Date Noted  . Protein-calorie malnutrition, severe 04/16/2016  . Subdural hematoma ([place]) 04/14/2016    Past Surgical History:  Procedure Laterality Date  . CATARACT EXTRACTION      Prior to Admission medications   Medication Sig Start Date End Date Taking? Authorizing Provider  albuterol (PROVENTIL HFA;VENTOLIN HFA) 108 (90 Base) MCG/ACT inhaler Inhale 2 puffs into the lungs every 6 (six) hours as needed for wheezing or shortness of breath. 12/10/16   Carrie Mew, MD  alum & mag hydroxide-simeth (MAALOX PLUS) 400-400-40 MG/5ML suspension Take 30 mLs by mouth every 6 (six) hours as needed for indigestion.    Historical Provider, MD  doxycycline (VIBRA-TABS) 100 MG tablet Take 1 tablet (100 mg total) by mouth 2 (two) times daily. Patient not taking: Reported on 01/16/2017 12/10/16   Carrie Mew, MD  escitalopram (LEXAPRO) 10 MG tablet Take 10 mg by mouth every morning.    Historical Provider, MD  finasteride (PROSCAR) 5 MG tablet Take 5 mg by mouth daily.    Historical Provider, MD  folic acid (FOLVITE) 1 MG tablet Take 1 mg by mouth daily.    Historical Provider, MD  guaiFENesin (ROBITUSSIN)  100 MG/5ML SOLN Take 10 mLs by mouth every 6 (six) hours as needed for cough.    Historical Provider, MD  levothyroxine (SYNTHROID, LEVOTHROID) 75 MCG tablet Take 75 mcg by mouth daily before breakfast.    Historical Provider, MD  loperamide (IMODIUM) 2 MG capsule Take 2 mg by mouth as needed for diarrhea or loose stools.    Historical Provider, MD  magnesium hydroxide (MILK OF MAGNESIA) 400 MG/5ML suspension Take 30 mLs by mouth at bedtime as needed for mild constipation.    Historical Provider, MD  neomycin-bacitracin-polymyxin (NEOSPORIN) [phone] ointment Apply 1 application topically 2 (two) times daily. Apply to great right toe nailbed    Historical Provider, MD  NIFEdipine (PROCARDIA-XL/ADALAT-CC/NIFEDICAL-XL) 30 MG 24 hr tablet Take 30 mg by mouth daily.    Historical Provider, MD  predniSONE (DELTASONE) 20 MG tablet Take 2 tablets (40 mg total) by mouth daily. Patient not taking: Reported on 01/16/2017 12/10/16 12/10/17  Carrie Mew, MD  QUEtiapine (SEROQUEL) 25 MG tablet Take 1 tablet (25 mg total) by mouth every 8 (eight) hours as needed (agitation). Patient not taking: Reported on 01/16/2017 04/18/16   Dustin Flock, MD  tamsulosin (FLOMAX) 0.4 MG CAPS capsule Take 0.4 mg by mouth daily.    Historical Provider, MD  triamcinolone cream (KENALOG) 0.1 % Apply 1 application topically 2 (two) times daily. Apply to facial and neck rash daily at 0800 and 2000    Historical Provider, MD    Allergies Corn starch and Corn-containing products  Family History  Problem Relation Age of Onset  . Hypertension Other     Social History Social History  Substance Use Topics  . Smoking status: Former Research scientist (life sciences)  . Smokeless tobacco: Never Used  . Alcohol use No    Review of Systems Level V exemption history Limited by the patient's dementia 10-point ROS otherwise negative.  ____________________________________________   PHYSICAL EXAM:  VITAL SIGNS: ED Triage Vitals [02/25/17 2017]  Enc  Vitals Group     BP      Pulse      Resp      Temp      Temp src      SpO2      Weight 170 lb (77.1 kg)     Height 5\' 9"  (1.753 m)     Head Circumference      Peak Flow      Pain Score      Pain Loc      Pain Edu?      Excl. in [place]?     Constitutional: No acute distress confused mumbling speech Eyes: PERRL EOMI. Head: Small ecchymosis to the vertex of his head. Nose: No congestion/rhinnorhea. Mouth/Throat: No trismus Neck: No stridor.  No cervical spine tenderness to palpation. Cardiovascular: Normal rate, regular rhythm. Grossly normal heart sounds.  Good peripheral circulation. Respiratory: Normal respiratory effort.  No retractions. Lungs CTAB and moving good air Gastrointestinal: Soft nondistended nontender no rebound no guarding no peritonitis no McBurney's tenderness negative Rovsing's no costovertebral tenderness negative Murphy's Musculoskeletal: No lower extremity edema   Neurologic:  Normal speech and language. No gross focal neurologic deficits are appreciated. Skin:  10 cm laceration to the dorsal aspect of his left forearm not violating the muscle layer. 3 small skin tears to the dorsal aspect of his left elbow Psychiatric: Mood and affect are normal. Speech and behavior are normal.     LABS (all labs ordered are listed, but only abnormal results are displayed)  Labs Reviewed  COMPREHENSIVE METABOLIC PANEL - Abnormal; Notable for the following:       Result Value   Potassium 3.2 (*)    Glucose, Bld 123 (*)    Calcium 8.3 (*)    ALT 10 (*)    Total Bilirubin 1.3 (*)    GFR calc non Af Amer 53 (*)    All other components within normal limits  CBC WITH DIFFERENTIAL/PLATELET - Abnormal; Notable for the following:    RBC 4.09 (*)    Hemoglobin 12.4 (*)    HCT 36.4 (*)    RDW 15.6 (*)    Platelets 137 (*)    Lymphs Abs 0.6 (*)    All other components within normal limits  TROPONIN I  CK  URINALYSIS, COMPLETE (UACMP) WITH MICROSCOPIC    No acute  ischemia __________________________________________  EKG  ED ECG REPORT I, Darel Hong, the attending physician, personally viewed and interpreted this ECG.  Date: 02/25/2017 Rate: 86 Rhythm: normal sinus rhythm QRS Axis: normal Intervals: normal ST/T Wave abnormalities: normal Conduction Disturbances: none Narrative Interpretation: unremarkable  ____________________________________________  RADIOLOGY  CT scans and x-rays no acute disease ____________________________________________   PROCEDURES  Procedure(s) performed: yes  LACERATION REPAIR Performed by: Darel Hong Authorized by: Darel Hong Consent: Verbal consent obtained. Risks and benefits: risks, benefits and alternatives were discussed Consent given by: patient Patient identity confirmed: provided demographic data Prepped and Draped in normal sterile fashion Wound explored  Laceration Location: dorsal left forearm  Laceration Length: 10cm  No Foreign Bodies seen or palpated   Irrigation method: syringe Amount of cleaning: standard  Skin closure: with steristrips and dermabond over    Patient tolerance: Patient tolerated the procedure well with no immediate complications.    Procedures  Critical Care performed: no  ____________________________________________   INITIAL IMPRESSION / ASSESSMENT AND PLAN / ED COURSE  Pertinent labs & imaging results that were available during my care of the patient were reviewed by me and considered in my medical decision making (see chart for details).  According to the daughter who is on the phone and the patient's niece who is at bedside the patient is not behaving normally and his speech is somewhat slurred. The only objective signs of trauma that I can see on his body are the significant laceration to the dorsal aspect of his left forearm, multiple tears to his left elbow. He does have tenderness across his chest as well.     Fortunately the  patient's imaging is negative for acute pathology. I washed out his wound with copious amounts of normal saline and explored it in a bloodless field with good overhead lighting identifying no tendon involvement and no foreign bodies. His skin is extremely frail so I closed it with Steri-Strips and Dermabond across the top. He is discharged back to his nursing home in good and improved condition. ____________________________________________   FINAL CLINICAL IMPRESSION(S) / ED DIAGNOSES  Final diagnoses:  Fall, initial encounter  Skin tear of forearm without complication, left, initial encounter      NEW MEDICATIONS STARTED DURING THIS VISIT:  New Prescriptions   No medications on file     Note:  This document was prepared using Dragon voice recognition software and may include unintentional dictation errors.     Darel Hong, MD 02/25/17 [phone]

## 2017-02-25 NOTE — ED Notes (Signed)
EMS transport called to transport pt back to [facility] for discharge.  Pt in room lying comfortably, updated on plan

## 2017-02-25 NOTE — ED Triage Notes (Addendum)
Per ems, pt found by nh staff at [place] on the floor. Per staff pt normally walks with a cane. Pt states slipped and fell. Laceration to left forearm

## 2017-02-25 NOTE — Discharge Instructions (Signed)
Please return to the emergency department for any new or worsening symptoms such as fevers, chills, discharge from your arm, or for any other concerns. A physician needs to see your arm in 2 days for a recheck to make sure it is not infected.  It was a pleasure to take care of you today, and thank you for coming to our emergency department.  If you have any questions or concerns before leaving please ask the nurse to grab me and I'm more than happy to go through your aftercare instructions again.  If you were prescribed any opioid pain medication today such as Norco, Vicodin, Percocet, morphine, hydrocodone, or oxycodone please make sure you do not drive when you are taking this medication as it can alter your ability to drive safely.  If you have any concerns once you are home that you are not improving or are in fact getting worse before you can make it to your follow-up appointment, please do not hesitate to call 911 and come back for further evaluation.  Darel Hong MD  Results for orders placed or performed during the hospital encounter of 02/25/17  Comprehensive metabolic panel  Result Value Ref Range   Sodium 136 135 - 145 mmol/L   Potassium 3.2 (L) 3.5 - 5.1 mmol/L   Chloride 104 101 - 111 mmol/L   CO2 23 22 - 32 mmol/L   Glucose, Bld 123 (H) 65 - 99 mg/dL   BUN 15 6 - 20 mg/dL   Creatinine, Ser 1.14 0.61 - 1.24 mg/dL   Calcium 8.3 (L) 8.9 - 10.3 mg/dL   Total Protein 6.7 6.5 - 8.1 g/dL   Albumin 3.7 3.5 - 5.0 g/dL   AST 23 15 - 41 U/L   ALT 10 (L) 17 - 63 U/L   Alkaline Phosphatase 74 38 - 126 U/L   Total Bilirubin 1.3 (H) 0.3 - 1.2 mg/dL   GFR calc non Af Amer 53 (L) >60 mL/min   GFR calc Af Amer >60 >60 mL/min   Anion gap 9 5 - 15  Troponin I  Result Value Ref Range   Troponin I <0.03 <0.03 ng/mL  CBC with Differential  Result Value Ref Range   WBC 6.9 3.8 - 10.6 K/uL   RBC 4.09 (L) 4.40 - 5.90 MIL/uL   Hemoglobin 12.4 (L) 13.0 - 18.0 g/dL   HCT 36.4 (L) 40.0 -  52.0 %   MCV 89.1 80.0 - 100.0 fL   MCH 30.3 26.0 - 34.0 pg   MCHC 34.0 32.0 - 36.0 g/dL   RDW 15.6 (H) 11.5 - 14.5 %   Platelets 137 (L) 150 - 440 K/uL   Neutrophils Relative % 81 %   Neutro Abs 5.6 1.4 - 6.5 K/uL   Lymphocytes Relative 9 %   Lymphs Abs 0.6 (L) 1.0 - 3.6 K/uL   Monocytes Relative 10 %   Monocytes Absolute 0.7 0.2 - 1.0 K/uL   Eosinophils Relative 0 %   Eosinophils Absolute 0.0 0 - 0.7 K/uL   Basophils Relative 0 %   Basophils Absolute 0.0 0 - 0.1 K/uL  CK  Result Value Ref Range   Total CK 55 49 - 397 U/L   Dg Chest 1 View  Result Date: 02/25/2017 CLINICAL DATA:  Found down at care facility. EXAM: CHEST 1 VIEW COMPARISON:  Chest radiograph December 19, 2016 FINDINGS: Cardiac silhouette is normal size. Similar fullness the hilum with spiculated density along the LEFT suprahilar region. Mild chronic interstitial changes  and increased lung volume pleural effusion or focal consolidation. Biapical pleural thickening. Osteopenia. Soft tissue planes are nonsuspicious. IMPRESSION: No acute cardiopulmonary process. Similar COPD and cardiomegaly. Stable possible hilar mass which would better characterized on CT chest with contrast if clinically indicated. Electronically Signed   By: Elon Alas M.D.   On: 02/25/2017 21:48   Dg Elbow Complete Left  Result Date: 02/25/2017 CLINICAL DATA:  Post unwitnessed fall at nursing home. Left elbow and forearm pain. EXAM: LEFT ELBOW - COMPLETE 3+ VIEW COMPARISON:  None. FINDINGS: There is no evidence of fracture, dislocation, or joint effusion. Mild osteoarthritis with coronoid spurring. There is a large olecranon spur are. Probable soft tissue phleboliths. IMPRESSION: No acute fracture or subluxation of the left elbow. Electronically Signed   By: Jeb Levering M.D.   On: 02/25/2017 21:50   Dg Forearm Left  Result Date: 02/25/2017 CLINICAL DATA:  Post unwitnessed fall at nursing home. Left elbow and forearm pain. EXAM: LEFT FOREARM -  2 VIEW COMPARISON:  None. FINDINGS: There is no evidence of fracture or other focal bone lesions. Scattered soft tissue phleboliths. Prominent olecranon spur. IMPRESSION: No fracture of the left forearm. Electronically Signed   By: Jeb Levering M.D.   On: 02/25/2017 21:51   Ct Head Wo Contrast  Result Date: 02/25/2017 CLINICAL DATA:  Slipped and fell while walking, with concern for head or cervical spine injury. Initial encounter. EXAM: CT HEAD WITHOUT CONTRAST CT CERVICAL SPINE WITHOUT CONTRAST TECHNIQUE: Multidetector CT imaging of the head and cervical spine was performed following the standard protocol without intravenous contrast. Multiplanar CT image reconstructions of the cervical spine were also generated. COMPARISON:  CT of the head performed 12/05/2016, and CT of the cervical spine performed 04/14/2016 FINDINGS: CT HEAD FINDINGS Brain: No evidence of acute infarction, hemorrhage, hydrocephalus, extra-axial collection or mass lesion/mass effect. Prominence of the ventricles and sulci reflects moderate cortical volume loss. Cerebellar atrophy is noted. Scattered periventricular and subcortical white matter change likely reflects small vessel ischemic microangiopathy. The brainstem and fourth ventricle are within normal limits. The basal ganglia are unremarkable in appearance. The cerebral hemispheres demonstrate grossly normal gray-white differentiation. No mass effect or midline shift is seen. Vascular: No hyperdense vessel or unexpected calcification. Skull: There is no evidence of fracture; visualized osseous structures are unremarkable in appearance. Sinuses/Orbits: The orbits are within normal limits. There is minimal partial opacification of the mastoid air cells bilaterally. The paranasal sinuses are well-aerated. Other: No significant soft tissue abnormalities are seen. CT CERVICAL SPINE FINDINGS Alignment: Normal. Skull base and vertebrae: No acute fracture. No primary bone lesion or focal  pathologic process. Soft tissues and spinal canal: No prevertebral fluid or swelling. No visible canal hematoma. Disc levels: Diffuse anterior bridging osteophytes are seen along the cervical and upper thoracic spine, with scattered calcification of the posterior longitudinal ligament along the mid cervical spine. Mild degenerative change is noted about the dens. Intervertebral disc spaces are grossly preserved. Upper chest: The thyroid gland is unremarkable in appearance. The visualized lung bases are clear. Other: No additional soft tissue abnormalities are seen. IMPRESSION: 1. No evidence of traumatic intracranial injury or fracture. 2. No evidence of fracture or subluxation along the cervical spine. 3. Moderate cortical volume loss and scattered small vessel ischemic microangiopathy. 4. Diffuse anterior bridging osteophytes along the cervical and upper thoracic spine, with scattered calcification of the posterior longitudinal ligament along the mid cervical spine. Electronically Signed   By: Garald Balding M.D.   On: 02/25/2017  21:24   Ct Cervical Spine Wo Contrast  Result Date: 02/25/2017 CLINICAL DATA:  Slipped and fell while walking, with concern for head or cervical spine injury. Initial encounter. EXAM: CT HEAD WITHOUT CONTRAST CT CERVICAL SPINE WITHOUT CONTRAST TECHNIQUE: Multidetector CT imaging of the head and cervical spine was performed following the standard protocol without intravenous contrast. Multiplanar CT image reconstructions of the cervical spine were also generated. COMPARISON:  CT of the head performed 12/05/2016, and CT of the cervical spine performed 04/14/2016 FINDINGS: CT HEAD FINDINGS Brain: No evidence of acute infarction, hemorrhage, hydrocephalus, extra-axial collection or mass lesion/mass effect. Prominence of the ventricles and sulci reflects moderate cortical volume loss. Cerebellar atrophy is noted. Scattered periventricular and subcortical white matter change likely reflects  small vessel ischemic microangiopathy. The brainstem and fourth ventricle are within normal limits. The basal ganglia are unremarkable in appearance. The cerebral hemispheres demonstrate grossly normal gray-white differentiation. No mass effect or midline shift is seen. Vascular: No hyperdense vessel or unexpected calcification. Skull: There is no evidence of fracture; visualized osseous structures are unremarkable in appearance. Sinuses/Orbits: The orbits are within normal limits. There is minimal partial opacification of the mastoid air cells bilaterally. The paranasal sinuses are well-aerated. Other: No significant soft tissue abnormalities are seen. CT CERVICAL SPINE FINDINGS Alignment: Normal. Skull base and vertebrae: No acute fracture. No primary bone lesion or focal pathologic process. Soft tissues and spinal canal: No prevertebral fluid or swelling. No visible canal hematoma. Disc levels: Diffuse anterior bridging osteophytes are seen along the cervical and upper thoracic spine, with scattered calcification of the posterior longitudinal ligament along the mid cervical spine. Mild degenerative change is noted about the dens. Intervertebral disc spaces are grossly preserved. Upper chest: The thyroid gland is unremarkable in appearance. The visualized lung bases are clear. Other: No additional soft tissue abnormalities are seen. IMPRESSION: 1. No evidence of traumatic intracranial injury or fracture. 2. No evidence of fracture or subluxation along the cervical spine. 3. Moderate cortical volume loss and scattered small vessel ischemic microangiopathy. 4. Diffuse anterior bridging osteophytes along the cervical and upper thoracic spine, with scattered calcification of the posterior longitudinal ligament along the mid cervical spine. Electronically Signed   By: Garald Balding M.D.   On: 02/25/2017 21:24   Dg Hip Unilat With Pelvis 2-3 Views Left  Result Date: 02/25/2017 CLINICAL DATA:  Unwitnessed fall at  nursing home. EXAM: DG HIP (WITH OR WITHOUT PELVIS) 2-3V LEFT COMPARISON:  Left hip pain. FINDINGS: The cortical margins of the bony pelvis and left hip are intact. No fracture. Pubic symphysis and sacroiliac joints are congruent. Both femoral heads are well-seated in the respective acetabula. Osteoarthritis of both hips with subchondral cystic change in acetabular spurring. Degenerative change in the visualized lumbar spine. IMPRESSION: No evidence of pelvic or left hip fracture. Electronically Signed   By: Jeb Levering M.D.   On: 02/25/2017 21:49

## 2017-02-27 DIAGNOSIS — I499 Cardiac arrhythmia, unspecified: Secondary | ICD-10-CM | POA: Diagnosis not present

## 2017-02-27 DIAGNOSIS — S51811A Laceration without foreign body of right forearm, initial encounter: Secondary | ICD-10-CM | POA: Diagnosis not present

## 2017-02-27 DIAGNOSIS — G309 Alzheimer's disease, unspecified: Secondary | ICD-10-CM | POA: Diagnosis not present

## 2017-02-27 DIAGNOSIS — G4751 Confusional arousals: Secondary | ICD-10-CM | POA: Diagnosis not present

## 2017-02-27 DIAGNOSIS — H9190 Unspecified hearing loss, unspecified ear: Secondary | ICD-10-CM | POA: Diagnosis not present

## 2017-02-27 DIAGNOSIS — F339 Major depressive disorder, recurrent, unspecified: Secondary | ICD-10-CM | POA: Diagnosis not present

## 2017-02-27 DIAGNOSIS — J40 Bronchitis, not specified as acute or chronic: Secondary | ICD-10-CM | POA: Diagnosis not present

## 2017-02-27 DIAGNOSIS — I10 Essential (primary) hypertension: Secondary | ICD-10-CM | POA: Diagnosis not present

## 2017-02-28 ENCOUNTER — Encounter: Payer: Self-pay | Admitting: Emergency Medicine

## 2017-02-28 ENCOUNTER — Other Ambulatory Visit: Payer: Self-pay

## 2017-02-28 ENCOUNTER — Emergency Department
Admission: EM | Admit: 2017-02-28 | Discharge: 2017-02-28 | Disposition: A | Discharge status: Home / Self Care | Payer: Medicare HMO | Attending: Emergency Medicine | Admitting: Emergency Medicine

## 2017-02-28 ENCOUNTER — Emergency Department: Payer: Medicare HMO

## 2017-02-28 DIAGNOSIS — Y929 Unspecified place or not applicable: Secondary | ICD-10-CM | POA: Insufficient documentation

## 2017-02-28 DIAGNOSIS — S50311A Abrasion of right elbow, initial encounter: Secondary | ICD-10-CM | POA: Diagnosis not present

## 2017-02-28 DIAGNOSIS — Z79899 Other long term (current) drug therapy: Secondary | ICD-10-CM | POA: Diagnosis not present

## 2017-02-28 DIAGNOSIS — S51011A Laceration without foreign body of right elbow, initial encounter: Secondary | ICD-10-CM | POA: Diagnosis not present

## 2017-02-28 DIAGNOSIS — S0990XA Unspecified injury of head, initial encounter: Secondary | ICD-10-CM | POA: Insufficient documentation

## 2017-02-28 DIAGNOSIS — S59901A Unspecified injury of right elbow, initial encounter: Secondary | ICD-10-CM | POA: Diagnosis present

## 2017-02-28 DIAGNOSIS — W06XXXA Fall from bed, initial encounter: Secondary | ICD-10-CM | POA: Insufficient documentation

## 2017-02-28 DIAGNOSIS — R531 Weakness: Secondary | ICD-10-CM | POA: Diagnosis not present

## 2017-02-28 DIAGNOSIS — Z87891 Personal history of nicotine dependence: Secondary | ICD-10-CM | POA: Insufficient documentation

## 2017-02-28 DIAGNOSIS — Y999 Unspecified external cause status: Secondary | ICD-10-CM | POA: Diagnosis not present

## 2017-02-28 DIAGNOSIS — I10 Essential (primary) hypertension: Secondary | ICD-10-CM | POA: Insufficient documentation

## 2017-02-28 DIAGNOSIS — Y939 Activity, unspecified: Secondary | ICD-10-CM | POA: Insufficient documentation

## 2017-02-28 DIAGNOSIS — S5001XA Contusion of right elbow, initial encounter: Secondary | ICD-10-CM | POA: Diagnosis not present

## 2017-02-28 DIAGNOSIS — S199XXA Unspecified injury of neck, initial encounter: Secondary | ICD-10-CM | POA: Diagnosis not present

## 2017-02-28 DIAGNOSIS — R6889 Other general symptoms and signs: Secondary | ICD-10-CM | POA: Diagnosis not present

## 2017-02-28 DIAGNOSIS — T07XXXA Unspecified multiple injuries, initial encounter: Secondary | ICD-10-CM

## 2017-02-28 DIAGNOSIS — W19XXXA Unspecified fall, initial encounter: Secondary | ICD-10-CM

## 2017-02-28 LAB — CBC
HCT: 35.8 % — ABNORMAL LOW (ref 40.0–52.0)
Hemoglobin: 12.3 g/dL — ABNORMAL LOW (ref 13.0–18.0)
MCH: 30.9 pg (ref 26.0–34.0)
MCHC: 34.2 g/dL (ref 32.0–36.0)
MCV: 90.3 fL (ref 80.0–100.0)
PLATELETS: 173 10*3/uL (ref 150–440)
RBC: 3.97 MIL/uL — AB (ref 4.40–5.90)
RDW: 15.3 % — ABNORMAL HIGH (ref 11.5–14.5)
WBC: 7.3 10*3/uL (ref 3.8–10.6)

## 2017-02-28 LAB — BASIC METABOLIC PANEL
Anion gap: 6 (ref 5–15)
BUN: 17 mg/dL (ref 6–20)
CO2: 27 mmol/L (ref 22–32)
CREATININE: 1.28 mg/dL — AB (ref 0.61–1.24)
Calcium: 8.7 mg/dL — ABNORMAL LOW (ref 8.9–10.3)
Chloride: 107 mmol/L (ref 101–111)
GFR, EST AFRICAN AMERICAN: 53 mL/min — AB (ref >60)
GFR, EST NON AFRICAN AMERICAN: 46 mL/min — AB (ref >60)
Glucose, Bld: 103 mg/dL — ABNORMAL HIGH (ref 65–99)
POTASSIUM: 3.1 mmol/L — AB (ref 3.5–5.1)
SODIUM: 140 mmol/L (ref 135–145)

## 2017-02-28 MED ORDER — POTASSIUM CHLORIDE CRYS ER 20 MEQ PO TBCR
40.0000 meq | EXTENDED_RELEASE_TABLET | Freq: Once | ORAL | Status: AC
  Administered 2017-02-28: 40 meq via ORAL
  Filled 2017-02-28: qty 2

## 2017-02-28 NOTE — ED Notes (Signed)
Discussed discharge instructions, prescriptions, and follow-up care with patient's care givers. No questions or concerns at this time. Pt stable at discharge.

## 2017-02-28 NOTE — ED Triage Notes (Signed)
Pt arrived to the ED via EMS from [facility] after sustaining an unwitnessed fall. EMS reports that the Pt has been falling a lot lately secondary to the Pt wanting to get off of the bed. Pt is AOx3 in no apparent distress with no complaints of pain and no notable injuries.

## 2017-02-28 NOTE — ED Provider Notes (Signed)
[facility] Emergency Department Provider Note   ____________________________________________   First MD Initiated Contact with Patient 02/28/17 [phone]     (approximate)  I have reviewed the triage vital signs and the nursing notes.   HISTORY  Chief Complaint Fall    HPI Aaron Ellis is a 83 y.o. male who comes into the hospital today after a fall. The patient does not know what brings him here. He reports that he remembers falling. He states that he did not roll out of bed he tripped and fell. He denies pain anywhere. He has a skin tear on his right elbow. The patient denies any chest pain or shortness of breath. He also denies any nausea or vomiting. He states that he is all right. He reports that he knows where he is but he cannot tell me the name right now. He knows it is Tuesday but does not remember the month. He reports that he is an old man and sometimes he forgets. According to the patient's facility he has been falling more recently. They state that when he has to go to the bathroom he needs to just get up and go and he does not wait for assistance. The patient is here for evaluation.   Past Medical History:  Diagnosis Date  . Agitation   . Benign neoplasm of prostate   . Hypertension     Patient Active Problem List   Diagnosis Date Noted  . Protein-calorie malnutrition, severe 04/16/2016  . Subdural hematoma ([place]) 04/14/2016    Past Surgical History:  Procedure Laterality Date  . CATARACT EXTRACTION      Prior to Admission medications   Medication Sig Start Date End Date Taking? Authorizing Provider  albuterol (PROVENTIL HFA;VENTOLIN HFA) 108 (90 Base) MCG/ACT inhaler Inhale 2 puffs into the lungs every 6 (six) hours as needed for wheezing or shortness of breath. 12/10/16  Yes Carrie Mew, MD  alum & mag hydroxide-simeth (MAALOX PLUS) 400-400-40 MG/5ML suspension Take 30 mLs by mouth every 6 (six) hours as needed for indigestion.    Yes Historical Provider, MD  escitalopram (LEXAPRO) 10 MG tablet Take 10 mg by mouth every morning.   Yes Historical Provider, MD  finasteride (PROSCAR) 5 MG tablet Take 5 mg by mouth daily.   Yes Historical Provider, MD  folic acid (FOLVITE) 1 MG tablet Take 1 mg by mouth daily.   Yes Historical Provider, MD  guaiFENesin (ROBITUSSIN) 100 MG/5ML SOLN Take 10 mLs by mouth every 6 (six) hours as needed for cough.   Yes Historical Provider, MD  levothyroxine (SYNTHROID, LEVOTHROID) 75 MCG tablet Take 75 mcg by mouth daily before breakfast.   Yes Historical Provider, MD  loperamide (IMODIUM) 2 MG capsule Take 2 mg by mouth as needed for diarrhea or loose stools.   Yes Historical Provider, MD  magnesium hydroxide (MILK OF MAGNESIA) 400 MG/5ML suspension Take 30 mLs by mouth at bedtime as needed for mild constipation.   Yes Historical Provider, MD  neomycin-bacitracin-polymyxin (NEOSPORIN) [phone] ointment Apply 1 application topically 2 (two) times daily. Apply to great right toe nailbed   Yes Historical Provider, MD  QUEtiapine (SEROQUEL) 25 MG tablet Take 1 tablet (25 mg total) by mouth every 8 (eight) hours as needed (agitation). Patient not taking: Reported on 01/16/2017 04/18/16   Dustin Flock, MD    Allergies Corn starch and Corn-containing products  Family History  Problem Relation Age of Onset  . Hypertension Other     Social History  Social History  Substance Use Topics  . Smoking status: Former Research scientist (life sciences)  . Smokeless tobacco: Never Used  . Alcohol use No    Review of Systems Constitutional: No fever/chills Eyes: No visual changes. ENT: No sore throat. Cardiovascular: Denies chest pain. Respiratory: Denies shortness of breath. Gastrointestinal: No abdominal pain.  No nausea, no vomiting.  No diarrhea.  No constipation. Genitourinary: Negative for dysuria. Musculoskeletal: Negative for back pain. Skin: Negative for rash. Neurological: Negative for headaches, focal weakness or  numbness.  10-point ROS otherwise negative.  ____________________________________________   PHYSICAL EXAM:  VITAL SIGNS: ED Triage Vitals  Enc Vitals Group     BP 02/28/17 0511 (!) 185/87     Pulse Rate 02/28/17 0511 86     Resp 02/28/17 0511 20     Temp 02/28/17 0511 98.8 F (37.1 C)     Temp Source 02/28/17 0511 Oral     SpO2 02/28/17 0511 95 %     Weight 02/28/17 0513 172 lb (78 kg)     Height 02/28/17 0513 5\' 9"  (1.753 m)     Head Circumference --      Peak Flow --      Pain Score 02/28/17 0511 0     Pain Loc --      Pain Edu? --      Excl. in [place]? --     Constitutional: Alert and oriented. Well appearing and in mild distress. Eyes: Conjunctivae are normal. PERRL. EOMI. Head: Atraumatic. Nose: No congestion/rhinnorhea. Mouth/Throat: Mucous membranes are moist.  Oropharynx non-erythematous. Neck: No cervical spine tenderness to palpation. Cardiovascular: Normal rate, regular rhythm. Grossly normal heart sounds.  Good peripheral circulation. Respiratory: Normal respiratory effort.  No retractions. Lungs CTAB. Gastrointestinal: Soft and nontender. No distention. Positive bowel sounds Musculoskeletal: No lower extremity tenderness nor edema.  Neurologic:  Normal speech and language.  Skin:  Skin is warm, dry bruising to right elbow with a skin tear. Bandage on the patient's left arm that appears old. Psychiatric: Mood and affect are normal.   ____________________________________________   LABS (all labs ordered are listed, but only abnormal results are displayed)  Labs Reviewed  CBC - Abnormal; Notable for the following:       Result Value   RBC 3.97 (*)    Hemoglobin 12.3 (*)    HCT 35.8 (*)    RDW 15.3 (*)    All other components within normal limits  BASIC METABOLIC PANEL - Abnormal; Notable for the following:    Potassium 3.1 (*)    Glucose, Bld 103 (*)    Creatinine, Ser 1.28 (*)    Calcium 8.7 (*)    GFR calc non Af Amer 46 (*)    GFR calc Af Amer 53  (*)    All other components within normal limits   ____________________________________________  EKG  ED ECG REPORT I, Loney Hering, the attending physician, personally viewed and interpreted this ECG.   Date: 02/28/2017  EKG Time: 517  Rate: 86  Rhythm: normal sinus rhythm, multiple PVCs  Axis: normal  Intervals:none  ST&T Change: none  ____________________________________________  RADIOLOGY  CT head and cervical spine ____________________________________________   PROCEDURES  Procedure(s) performed: None  Procedures  Critical Care performed: No  ____________________________________________   INITIAL IMPRESSION / ASSESSMENT AND PLAN / ED COURSE  Pertinent labs & imaging results that were available during my care of the patient were reviewed by me and considered in my medical decision making (see chart for details).  This is a 83 year old who comes into the hospital today with a fall at his nursing home. The patient is falling more frequently recently.  Clinical Course as of Feb 29 804  Tue Feb 28, 2017  0723 1. No acute intracranial abnormality or displaced calvarial fracture. 2. Stable moderate chronic microvascular ischemic changes and mild parenchymal volume loss of the brain. 3. No acute fracture or dislocation of the cervical spine spine. 4. Findings of DISH and OPLL. No high-grade bony canal stenosis.   CT Head Wo Contrast [AW]    Clinical Course User Index [AW] Loney Hering, MD   The patient's potassium was a little low but otherwise his blood work is unremarkable. I spoke to the patient's daughter and he is at his baseline. She reports his blood pressure was low but elevated but has been that way. I encouraged her to have him follow-up with his primary care physician. Otherwise the patient has no other complaints. He'll be discharged home.  ____________________________________________   FINAL CLINICAL IMPRESSION(S) / ED  DIAGNOSES  Final diagnoses:  Fall, initial encounter  Multiple contusions  Abrasions of multiple sites      NEW MEDICATIONS STARTED DURING THIS VISIT:  New Prescriptions   No medications on file     Note:  This document was prepared using Dragon voice recognition software and may include unintentional dictation errors.    Loney Hering, MD 02/28/17 [phone]

## 2017-02-28 NOTE — ED Notes (Signed)
Pt went to CT

## 2017-03-02 DIAGNOSIS — G309 Alzheimer's disease, unspecified: Secondary | ICD-10-CM | POA: Diagnosis not present

## 2017-03-02 DIAGNOSIS — I10 Essential (primary) hypertension: Secondary | ICD-10-CM | POA: Diagnosis not present

## 2017-03-02 DIAGNOSIS — S51811A Laceration without foreign body of right forearm, initial encounter: Secondary | ICD-10-CM | POA: Diagnosis not present

## 2017-03-02 DIAGNOSIS — G4751 Confusional arousals: Secondary | ICD-10-CM | POA: Diagnosis not present

## 2017-03-02 DIAGNOSIS — J40 Bronchitis, not specified as acute or chronic: Secondary | ICD-10-CM | POA: Diagnosis not present

## 2017-03-02 DIAGNOSIS — I499 Cardiac arrhythmia, unspecified: Secondary | ICD-10-CM | POA: Diagnosis not present

## 2017-03-02 DIAGNOSIS — R269 Unspecified abnormalities of gait and mobility: Secondary | ICD-10-CM | POA: Diagnosis not present

## 2017-03-02 DIAGNOSIS — F339 Major depressive disorder, recurrent, unspecified: Secondary | ICD-10-CM | POA: Diagnosis not present

## 2017-03-08 DIAGNOSIS — I10 Essential (primary) hypertension: Secondary | ICD-10-CM | POA: Diagnosis not present

## 2017-03-08 DIAGNOSIS — S51011A Laceration without foreign body of right elbow, initial encounter: Secondary | ICD-10-CM | POA: Diagnosis not present

## 2017-03-08 DIAGNOSIS — K089 Disorder of teeth and supporting structures, unspecified: Secondary | ICD-10-CM | POA: Diagnosis not present

## 2017-03-08 DIAGNOSIS — H9190 Unspecified hearing loss, unspecified ear: Secondary | ICD-10-CM | POA: Diagnosis not present

## 2017-03-08 DIAGNOSIS — J40 Bronchitis, not specified as acute or chronic: Secondary | ICD-10-CM | POA: Diagnosis not present

## 2017-03-08 DIAGNOSIS — I499 Cardiac arrhythmia, unspecified: Secondary | ICD-10-CM | POA: Diagnosis not present

## 2017-03-08 DIAGNOSIS — G4751 Confusional arousals: Secondary | ICD-10-CM | POA: Diagnosis not present

## 2017-03-08 DIAGNOSIS — F339 Major depressive disorder, recurrent, unspecified: Secondary | ICD-10-CM | POA: Diagnosis not present

## 2017-03-19 DIAGNOSIS — J449 Chronic obstructive pulmonary disease, unspecified: Secondary | ICD-10-CM | POA: Diagnosis not present

## 2017-03-27 DIAGNOSIS — H9190 Unspecified hearing loss, unspecified ear: Secondary | ICD-10-CM | POA: Diagnosis not present

## 2017-03-27 DIAGNOSIS — G4751 Confusional arousals: Secondary | ICD-10-CM | POA: Diagnosis not present

## 2017-03-27 DIAGNOSIS — I10 Essential (primary) hypertension: Secondary | ICD-10-CM | POA: Diagnosis not present

## 2017-03-27 DIAGNOSIS — F339 Major depressive disorder, recurrent, unspecified: Secondary | ICD-10-CM | POA: Diagnosis not present

## 2017-03-27 DIAGNOSIS — G309 Alzheimer's disease, unspecified: Secondary | ICD-10-CM | POA: Diagnosis not present

## 2017-03-30 ENCOUNTER — Emergency Department: Payer: Medicare HMO

## 2017-03-30 ENCOUNTER — Inpatient Hospital Stay
Admission: EM | Admit: 2017-03-30 | Discharge: 2017-04-04 | DRG: 193 | Disposition: A | Discharge status: Hospice | Payer: Medicare HMO | Attending: Internal Medicine | Admitting: Internal Medicine

## 2017-03-30 DIAGNOSIS — N401 Enlarged prostate with lower urinary tract symptoms: Secondary | ICD-10-CM | POA: Diagnosis present

## 2017-03-30 DIAGNOSIS — D696 Thrombocytopenia, unspecified: Secondary | ICD-10-CM | POA: Diagnosis not present

## 2017-03-30 DIAGNOSIS — Z6824 Body mass index (BMI) 24.0-24.9, adult: Secondary | ICD-10-CM | POA: Diagnosis not present

## 2017-03-30 DIAGNOSIS — I10 Essential (primary) hypertension: Secondary | ICD-10-CM | POA: Diagnosis not present

## 2017-03-30 DIAGNOSIS — R410 Disorientation, unspecified: Secondary | ICD-10-CM | POA: Diagnosis not present

## 2017-03-30 DIAGNOSIS — Y95 Nosocomial condition: Secondary | ICD-10-CM | POA: Diagnosis present

## 2017-03-30 DIAGNOSIS — Z87891 Personal history of nicotine dependence: Secondary | ICD-10-CM | POA: Diagnosis not present

## 2017-03-30 DIAGNOSIS — G309 Alzheimer's disease, unspecified: Secondary | ICD-10-CM | POA: Diagnosis not present

## 2017-03-30 DIAGNOSIS — Z515 Encounter for palliative care: Secondary | ICD-10-CM | POA: Diagnosis present

## 2017-03-30 DIAGNOSIS — R4182 Altered mental status, unspecified: Secondary | ICD-10-CM | POA: Diagnosis not present

## 2017-03-30 DIAGNOSIS — Z66 Do not resuscitate: Secondary | ICD-10-CM | POA: Diagnosis present

## 2017-03-30 DIAGNOSIS — R627 Adult failure to thrive: Secondary | ICD-10-CM | POA: Diagnosis not present

## 2017-03-30 DIAGNOSIS — S299XXA Unspecified injury of thorax, initial encounter: Secondary | ICD-10-CM | POA: Diagnosis not present

## 2017-03-30 DIAGNOSIS — R338 Other retention of urine: Secondary | ICD-10-CM | POA: Diagnosis present

## 2017-03-30 DIAGNOSIS — N289 Disorder of kidney and ureter, unspecified: Secondary | ICD-10-CM | POA: Diagnosis not present

## 2017-03-30 DIAGNOSIS — J189 Pneumonia, unspecified organism: Secondary | ICD-10-CM | POA: Diagnosis not present

## 2017-03-30 DIAGNOSIS — Z79899 Other long term (current) drug therapy: Secondary | ICD-10-CM | POA: Diagnosis not present

## 2017-03-30 DIAGNOSIS — F0280 Dementia in other diseases classified elsewhere without behavioral disturbance: Secondary | ICD-10-CM | POA: Diagnosis present

## 2017-03-30 DIAGNOSIS — G9341 Metabolic encephalopathy: Secondary | ICD-10-CM | POA: Diagnosis not present

## 2017-03-30 DIAGNOSIS — W19XXXA Unspecified fall, initial encounter: Secondary | ICD-10-CM | POA: Diagnosis not present

## 2017-03-30 DIAGNOSIS — E41 Nutritional marasmus: Secondary | ICD-10-CM | POA: Diagnosis not present

## 2017-03-30 DIAGNOSIS — R509 Fever, unspecified: Secondary | ICD-10-CM | POA: Diagnosis not present

## 2017-03-30 DIAGNOSIS — G9340 Encephalopathy, unspecified: Secondary | ICD-10-CM | POA: Diagnosis not present

## 2017-03-30 DIAGNOSIS — Z7401 Bed confinement status: Secondary | ICD-10-CM | POA: Diagnosis not present

## 2017-03-30 DIAGNOSIS — F0390 Unspecified dementia without behavioral disturbance: Secondary | ICD-10-CM | POA: Diagnosis not present

## 2017-03-30 DIAGNOSIS — S0990XA Unspecified injury of head, initial encounter: Secondary | ICD-10-CM | POA: Diagnosis not present

## 2017-03-30 LAB — URINALYSIS, COMPLETE (UACMP) WITH MICROSCOPIC
BACTERIA UA: NONE SEEN
BILIRUBIN URINE: NEGATIVE
Glucose, UA: NEGATIVE mg/dL
HGB URINE DIPSTICK: NEGATIVE
KETONES UR: NEGATIVE mg/dL
LEUKOCYTES UA: NEGATIVE
Nitrite: NEGATIVE
Protein, ur: NEGATIVE mg/dL
Specific Gravity, Urine: 1.011 (ref 1.005–1.030)
Squamous Epithelial / LPF: NONE SEEN
pH: 8 (ref 5.0–8.0)

## 2017-03-30 LAB — CBC WITH DIFFERENTIAL/PLATELET
BASOS PCT: 0 %
Basophils Absolute: 0 10*3/uL (ref 0–0.1)
Eosinophils Absolute: 0.1 10*3/uL (ref 0–0.7)
Eosinophils Relative: 1 %
HEMATOCRIT: 36 % — AB (ref 40.0–52.0)
Hemoglobin: 12.1 g/dL — ABNORMAL LOW (ref 13.0–18.0)
LYMPHS ABS: 0.7 10*3/uL — AB (ref 1.0–3.6)
LYMPHS PCT: 8 %
MCH: 30.6 pg (ref 26.0–34.0)
MCHC: 33.8 g/dL (ref 32.0–36.0)
MCV: 90.5 fL (ref 80.0–100.0)
MONO ABS: 0.7 10*3/uL (ref 0.2–1.0)
MONOS PCT: 8 %
NEUTROS ABS: 7.3 10*3/uL — AB (ref 1.4–6.5)
Neutrophils Relative %: 83 %
Platelets: 136 10*3/uL — ABNORMAL LOW (ref 150–440)
RBC: 3.97 MIL/uL — ABNORMAL LOW (ref 4.40–5.90)
RDW: 15.7 % — AB (ref 11.5–14.5)
WBC: 8.8 10*3/uL (ref 3.8–10.6)

## 2017-03-30 LAB — COMPREHENSIVE METABOLIC PANEL
ALT: 12 U/L — ABNORMAL LOW (ref 17–63)
ANION GAP: 7 (ref 5–15)
AST: 21 U/L (ref 15–41)
Albumin: 3.7 g/dL (ref 3.5–5.0)
Alkaline Phosphatase: 79 U/L (ref 38–126)
BILIRUBIN TOTAL: 0.6 mg/dL (ref 0.3–1.2)
BUN: 15 mg/dL (ref 6–20)
CO2: 27 mmol/L (ref 22–32)
Calcium: 8.8 mg/dL — ABNORMAL LOW (ref 8.9–10.3)
Chloride: 102 mmol/L (ref 101–111)
Creatinine, Ser: 1.19 mg/dL (ref 0.61–1.24)
GFR calc Af Amer: 58 mL/min — ABNORMAL LOW (ref >60)
GFR, EST NON AFRICAN AMERICAN: 50 mL/min — AB (ref >60)
Glucose, Bld: 117 mg/dL — ABNORMAL HIGH (ref 65–99)
POTASSIUM: 3.8 mmol/L (ref 3.5–5.1)
Sodium: 136 mmol/L (ref 135–145)
TOTAL PROTEIN: 6.9 g/dL (ref 6.5–8.1)

## 2017-03-30 LAB — PROTIME-INR
INR: 1.03
Prothrombin Time: 13.5 seconds (ref 11.4–15.2)

## 2017-03-30 LAB — LACTIC ACID, PLASMA: Lactic Acid, Venous: 1.3 mmol/L (ref 0.5–1.9)

## 2017-03-30 LAB — APTT: aPTT: 28 seconds (ref 24–36)

## 2017-03-30 LAB — LIPASE, BLOOD: LIPASE: 27 U/L (ref 11–51)

## 2017-03-30 LAB — TROPONIN I: Troponin I: <0.03 ng/mL (ref <0.03)

## 2017-03-30 MED ORDER — VANCOMYCIN HCL IN DEXTROSE 1-5 GM/200ML-% IV SOLN
1000.0000 mg | INTRAVENOUS | Status: DC
  Administered 2017-03-31: 1000 mg via INTRAVENOUS
  Filled 2017-03-30: qty 200

## 2017-03-30 MED ORDER — ENOXAPARIN SODIUM 40 MG/0.4ML ~~LOC~~ SOLN
40.0000 mg | SUBCUTANEOUS | Status: DC
  Administered 2017-03-31: 40 mg via SUBCUTANEOUS
  Filled 2017-03-30: qty 0.4

## 2017-03-30 MED ORDER — LEVOTHYROXINE SODIUM 50 MCG PO TABS
75.0000 ug | ORAL_TABLET | Freq: Every day | ORAL | Status: DC
  Administered 2017-03-31: 08:00:00 75 ug via ORAL
  Filled 2017-03-30: qty 1

## 2017-03-30 MED ORDER — VANCOMYCIN HCL IN DEXTROSE 1-5 GM/200ML-% IV SOLN
1000.0000 mg | Freq: Once | INTRAVENOUS | Status: AC
  Administered 2017-03-30: 1000 mg via INTRAVENOUS
  Filled 2017-03-30: qty 200

## 2017-03-30 MED ORDER — FINASTERIDE 5 MG PO TABS
5.0000 mg | ORAL_TABLET | Freq: Every day | ORAL | Status: DC
  Administered 2017-03-31: 5 mg via ORAL
  Filled 2017-03-30: qty 1

## 2017-03-30 MED ORDER — GUAIFENESIN 100 MG/5ML PO SOLN: 10.0000 mL | Freq: Four times a day (QID) | ORAL | Status: DC | PRN

## 2017-03-30 MED ORDER — FOLIC ACID 1 MG PO TABS
1.0000 mg | ORAL_TABLET | Freq: Every day | ORAL | Status: DC
  Administered 2017-03-31: 08:00:00 1 mg via ORAL
  Filled 2017-03-30: qty 1

## 2017-03-30 MED ORDER — PIPERACILLIN-TAZOBACTAM 3.375 G IVPB 30 MIN
3.3750 g | Freq: Once | INTRAVENOUS | Status: AC
  Administered 2017-03-30: 3.375 g via INTRAVENOUS
  Filled 2017-03-30: qty 50

## 2017-03-30 MED ORDER — MAGNESIUM HYDROXIDE 400 MG/5ML PO SUSP: 30.0000 mL | Freq: Every evening | ORAL | Status: DC | PRN

## 2017-03-30 MED ORDER — SODIUM CHLORIDE 0.9 % IV BOLUS (SEPSIS): 1000.0000 mL | Freq: Once | INTRAVENOUS | Status: DC

## 2017-03-30 MED ORDER — ALUM & MAG HYDROXIDE-SIMETH 200-200-20 MG/5ML PO SUSP
30.0000 mL | Freq: Four times a day (QID) | ORAL | Status: DC | PRN
Start: 2017-03-30 — End: 2017-04-01

## 2017-03-30 MED ORDER — PIPERACILLIN-TAZOBACTAM 3.375 G IVPB
3.3750 g | Freq: Three times a day (TID) | INTRAVENOUS | Status: DC
  Administered 2017-03-31 – 2017-04-01 (×4): 3.375 g via INTRAVENOUS
  Filled 2017-03-30 (×7): qty 50

## 2017-03-30 MED ORDER — ALBUTEROL SULFATE (2.5 MG/3ML) 0.083% IN NEBU: 2.5000 mg | INHALATION_SOLUTION | RESPIRATORY_TRACT | Status: DC | PRN

## 2017-03-30 MED ORDER — SODIUM CHLORIDE 0.9 % IV BOLUS (SEPSIS)
1000.0000 mL | Freq: Once | INTRAVENOUS | Status: AC
  Administered 2017-03-30: 1000 mL via INTRAVENOUS

## 2017-03-30 MED ORDER — ESCITALOPRAM OXALATE 10 MG PO TABS
10.0000 mg | ORAL_TABLET | ORAL | Status: DC
  Administered 2017-03-31: 08:00:00 10 mg via ORAL
  Filled 2017-03-30: qty 1

## 2017-03-30 MED ORDER — SODIUM CHLORIDE 0.9 % IV BOLUS (SEPSIS)
500.0000 mL | Freq: Once | INTRAVENOUS | Status: AC
  Administered 2017-03-30: 500 mL via INTRAVENOUS

## 2017-03-30 NOTE — ED Notes (Signed)
Pt's depends dry. Pt states has to urinate - urinal in place. Pt unable to urinate at this time.

## 2017-03-30 NOTE — ED Triage Notes (Signed)
Ems called out for unwitnessed fall - nh unsure if pt normally ambulates with walker. Ems noted pt febrile - they spoke with family and they stated the pt wasn't acting himself for the last week

## 2017-03-30 NOTE — ED Notes (Signed)
Pt transport to [facility] 

## 2017-03-30 NOTE — ED Notes (Signed)
Pt given a dinner tray with sandwich and sprite. Per daughter he didn't like the sandwich and wanted ice cream which was given

## 2017-03-30 NOTE — H&P (Signed)
[place] [facility] [place] NAME: Aaron Ellis    MR#:  [id number]  [place] OF BIRTH:  1919-08-03  DATE OF ADMISSION:  03/30/2017  PRIMARY CARE PHYSICIAN: Velta Addison, Claretha Cooper, DO   REQUESTING/REFERRING PHYSICIAN: Carrie Mew, MD  CHIEF COMPLAINT:   Chief Complaint  Patient presents with  . Fall  . Fever   Fever, confusion and fall. HISTORY OF PRESENT ILLNESS:  Aaron Ellis  is a 83 y.o. male with a known history of Hypertension, Alzheimer's dementia and Benign neoplasm of prostate. The patient was sent from [facility] to the ED due to above chief complaint. The patient is demented and confused, unable to provide any information. According to his daughter, the patient was found to have fever, cough and confusion. He fell in the facility. Chest x-ray show right base atelectasis or pneumonia. Urinalysis is normal. He is treated with antibiotics in the ED.  PAST MEDICAL HISTORY:   Past Medical History:  Diagnosis Date  . Agitation   . Benign neoplasm of prostate   . Hypertension     PAST SURGICAL HISTORY:   Past Surgical History:  Procedure Laterality Date  . CATARACT EXTRACTION      SOCIAL HISTORY:   Social History  Substance Use Topics  . Smoking status: Former Research scientist (life sciences)  . Smokeless tobacco: Never Used  . Alcohol use No    FAMILY HISTORY:   Family History  Problem Relation Age of Onset  . Hypertension Other     DRUG ALLERGIES:   Allergies  Allergen Reactions  . Corn Starch   . Corn-Containing Products     REVIEW OF SYSTEMS:   Review of Systems  Unable to perform ROS: Mental status change    MEDICATIONS AT HOME:   Prior to Admission medications   Medication Sig Start Date End Date Taking? Authorizing Provider  albuterol (PROVENTIL HFA;VENTOLIN HFA) 108 (90 Base) MCG/ACT inhaler Inhale 2 puffs into the lungs every 6 (six) hours as needed for wheezing or shortness of breath. 12/10/16  Yes Carrie Mew, MD  alum  & mag hydroxide-simeth (MAALOX PLUS) 400-400-40 MG/5ML suspension Take 30 mLs by mouth every 6 (six) hours as needed for indigestion.   Yes Historical Provider, MD  escitalopram (LEXAPRO) 10 MG tablet Take 10 mg by mouth every morning.   Yes Historical Provider, MD  finasteride (PROSCAR) 5 MG tablet Take 5 mg by mouth daily.   Yes Historical Provider, MD  folic acid (FOLVITE) 1 MG tablet Take 1 mg by mouth daily.   Yes Historical Provider, MD  guaiFENesin (ROBITUSSIN) 100 MG/5ML SOLN Take 10 mLs by mouth every 6 (six) hours as needed for cough.   Yes Historical Provider, MD  levothyroxine (SYNTHROID, LEVOTHROID) 75 MCG tablet Take 75 mcg by mouth daily before breakfast.   Yes Historical Provider, MD  loperamide (IMODIUM) 2 MG capsule Take 2 mg by mouth as needed for diarrhea or loose stools.   Yes Historical Provider, MD  magnesium hydroxide (MILK OF MAGNESIA) 400 MG/5ML suspension Take 30 mLs by mouth at bedtime as needed for mild constipation.   Yes Historical Provider, MD  neomycin-bacitracin-polymyxin (NEOSPORIN) [id number] ointment Apply 1 application topically 2 (two) times daily. Apply to great right toe nailbed   Yes Historical Provider, MD  QUEtiapine (SEROQUEL) 25 MG tablet Take 1 tablet (25 mg total) by mouth every 8 (eight) hours as needed (agitation). Patient not taking: Reported on 01/16/2017 04/18/16   Dustin Flock, MD  VITAL SIGNS:  Blood pressure (!) 185/120, pulse 90, temperature 99.6 F (37.6 C), resp. rate 18, height 5\' 9"  (1.753 m), weight 172 lb (78 kg), SpO2 97 %.  PHYSICAL EXAMINATION:  Physical Exam  GENERAL:  83 y.o.-year-old patient lying in the bed with no acute distress.  EYES: Pupils equal, round, reactive to light and accommodation. No scleral icterus. Extraocular muscles intact.  HEENT: Head atraumatic, normocephalic. Oropharynx and nasopharynx clear.  NECK:  Supple, no jugular venous distention. No thyroid enlargement, no tenderness.  LUNGS: Crackles in  bilateral bases, no wheezing, rales,rhonchi or crepitation. No use of accessory muscles of respiration.  CARDIOVASCULAR: S1, S2 normal. No murmurs, rubs, or gallops.  ABDOMEN: Soft, nontender, nondistended. Bowel sounds present. No organomegaly or mass.  EXTREMITIES: No pedal edema, cyanosis, or clubbing.  NEUROLOGIC: Unable to exam.  PSYCHIATRIC: The patient is Demented and confused. SKIN: No obvious rash, lesion, or ulcer.   LABORATORY PANEL:   CBC  Recent Labs Lab 03/30/17 1712  WBC 8.8  HGB 12.1*  HCT 36.0*  PLT 136*   ------------------------------------------------------------------------------------------------------------------  Chemistries   Recent Labs Lab 03/30/17 1712  NA 136  K 3.8  CL 102  CO2 27  GLUCOSE 117*  BUN 15  CREATININE 1.19  CALCIUM 8.8*  AST 21  ALT 12*  ALKPHOS 79  BILITOT 0.6   ------------------------------------------------------------------------------------------------------------------  Cardiac Enzymes  Recent Labs Lab 03/30/17 1712  TROPONINI <0.03   ------------------------------------------------------------------------------------------------------------------  RADIOLOGY:  Ct Head Wo Contrast  Result Date: 03/30/2017 CLINICAL DATA:  Altered mental status.  Fall EXAM: CT HEAD WITHOUT CONTRAST TECHNIQUE: Contiguous axial images were obtained from the base of the skull through the vertex without intravenous contrast. COMPARISON:  02/28/2017 FINDINGS: Brain: Moderate atrophy. Chronic white matter changes. Negative for acute infarct, hemorrhage, or mass lesion. Vascular: Negative for hyperdense vessel Skull: Negative for fracture. Sinuses/Orbits: Negative Other: None IMPRESSION: Moderate atrophy with chronic white matter changes. No acute abnormality. Electronically Signed   By: Franchot Gallo M.D.   On: 03/30/2017 19:25   Dg Chest Port 1 View  Result Date: 03/30/2017 CLINICAL DATA:  Unwitnessed fall EXAM: PORTABLE CHEST 1 VIEW  COMPARISON:  Portable exam 1729 hours compared 02/25/2017 FINDINGS: Normal heart size, mediastinal contours, and pulmonary vascularity. Mild RIGHT basilar atelectasis. Lungs otherwise clear. No pleural effusion or pneumothorax. Bones demineralized. Spur formation at inferior RIGHT glenoid rim. IMPRESSION: Mild RIGHT basilar atelectasis. Electronically Signed   By: Lavonia Dana M.D.   On: 03/30/2017 17:43      IMPRESSION AND PLAN:   Right-sided pneumonia, HAP. The patient will be admitted to medical floor. Start cefepime and vancomycin pharmacy to dose. Follow-up blood culture and sputum culture.  Acute metabolic encephalopathy. Due to above. 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  Alzheimer's dementia. Aspiration and fall precaution.   All the records are reviewed and case discussed with ED provider. Management plans discussed with the patient's daughter and they are in agreement.  CODE STATUS: DO NOT RESUSCITATE.  TOTAL TIME TAKING CARE OF THIS PATIENT: 53 minutes.    Demetrios Loll M.D on 03/30/2017 at 9:48 PM  Between 7am to 6pm - Pager - [phone]  After 6pm go to [email or web address] - password [email or web address] [facility]  [facility]  Office  [phone]  CC: Primary care physician; Velta Addison, Claretha Cooper, DO   Note: This dictation was prepared with Dragon dictation along with smaller phrase technology. Any transcriptional errors that result from this process are unintentional.

## 2017-03-30 NOTE — Progress Notes (Signed)
Pharmacy Antibiotic Note  Aaron Ellis is a 83 y.o. male admitted on 03/30/2017 with sepsis.  Pharmacy has been consulted for vancomycin and Zosyn dosing.  Plan: DW 73kg  Vd 51L kei 0.034 hr-1  t1/2 20 hours Vancomycin 1 gram q 24 hours ordered with stacked dosing. Level before 5th dose. Goal trough 15-20.  Zosyn 3.375 grams q 8 hours ordered.   Height: 5\' 8"  (172.7 cm) Weight: 161 lb 11.2 oz (73.3 kg) IBW/kg (Calculated) : 68.4  Temp (24hrs), Avg:98.9 F (37.2 C), Min:98.2 F (36.8 C), Max:99.6 F (37.6 C)   Recent Labs Lab 03/30/17 1712  WBC 8.8  CREATININE 1.19  LATICACIDVEN 1.3    Estimated Creatinine Clearance: 35.9 mL/min (by C-G formula based on SCr of 1.19 mg/dL).    Allergies  Allergen Reactions  . Corn Starch   . Corn-Containing Products     Antimicrobials this admission: vancomycin Zosyn 5/3 >>    >>   Dose adjustments this admission:   Microbiology results: 5/3 BCx: pending 5/3 UCx: pending  5/3 Sputum: pending       5/3 UA: (-) 5/3 CXR: R atelectasis  Thank you for allowing pharmacy to be a part of this patient's care.  Kanna Dafoe S 03/30/2017 11:41 PM

## 2017-03-30 NOTE — ED Provider Notes (Signed)
[facility] Emergency Department Provider Note  ____________________________________________  Time seen: Approximately 5:42 PM  I have reviewed the triage vital signs and the nursing notes.   HISTORY  Chief Complaint Fall and Fever Level 5 caveat:  Portions of the history and physical were unable to be obtained due to the patient's acute illnessAnd altered mental status    HPI Aaron Ellis is a 83 y.o. male sent to the ED by nursing home due to unwitnessed fall.Family also notes the patient has seemed confused and acting differently for the past week. Patient denies pain.     Past Medical History:  Diagnosis Date  . Agitation   . Benign neoplasm of prostate   . Hypertension      Patient Active Problem List   Diagnosis Date Noted  . Pneumonia 03/30/2017  . Protein-calorie malnutrition, severe 04/16/2016  . Subdural hematoma ([place]) 04/14/2016     Past Surgical History:  Procedure Laterality Date  . CATARACT EXTRACTION       Prior to Admission medications   Medication Sig Start Date End Date Taking? Authorizing Provider  albuterol (PROVENTIL HFA;VENTOLIN HFA) 108 (90 Base) MCG/ACT inhaler Inhale 2 puffs into the lungs every 6 (six) hours as needed for wheezing or shortness of breath. 12/10/16   Carrie Mew, MD  alum & mag hydroxide-simeth (MAALOX PLUS) 400-400-40 MG/5ML suspension Take 30 mLs by mouth every 6 (six) hours as needed for indigestion.    Historical Provider, MD  escitalopram (LEXAPRO) 10 MG tablet Take 10 mg by mouth every morning.    Historical Provider, MD  finasteride (PROSCAR) 5 MG tablet Take 5 mg by mouth daily.    Historical Provider, MD  folic acid (FOLVITE) 1 MG tablet Take 1 mg by mouth daily.    Historical Provider, MD  guaiFENesin (ROBITUSSIN) 100 MG/5ML SOLN Take 10 mLs by mouth every 6 (six) hours as needed for cough.    Historical Provider, MD  levothyroxine (SYNTHROID, LEVOTHROID) 75 MCG tablet Take 75 mcg by  mouth daily before breakfast.    Historical Provider, MD  loperamide (IMODIUM) 2 MG capsule Take 2 mg by mouth as needed for diarrhea or loose stools.    Historical Provider, MD  magnesium hydroxide (MILK OF MAGNESIA) 400 MG/5ML suspension Take 30 mLs by mouth at bedtime as needed for mild constipation.    Historical Provider, MD  neomycin-bacitracin-polymyxin (NEOSPORIN) [id number] ointment Apply 1 application topically 2 (two) times daily. Apply to great right toe nailbed    Historical Provider, MD  QUEtiapine (SEROQUEL) 25 MG tablet Take 1 tablet (25 mg total) by mouth every 8 (eight) hours as needed (agitation). Patient not taking: Reported on 01/16/2017 04/18/16   Dustin Flock, MD     Allergies Corn starch and Corn-containing products   Family History  Problem Relation Age of Onset  . Hypertension Other     Social History Social History  Substance Use Topics  . Smoking status: Former Research scientist (life sciences)  . Smokeless tobacco: Never Used  . Alcohol use No    Review of Systems Unable to reliably obtained due to altered mental status Constitutional: positive fevers.  ____________________________________________   PHYSICAL EXAM:  VITAL SIGNS: ED Triage Vitals  Enc Vitals Group     BP 03/30/17 1708 (!) 190/85     Pulse Rate 03/30/17 1708 (!) 101     Resp 03/30/17 1708 (!) 23     Temp 03/30/17 1708 99.6 F (37.6 C)     Temp src --  SpO2 03/30/17 1708 100 %     Weight 03/30/17 1709 172 lb (78 kg)     Height 03/30/17 1709 5\' 9"  (1.753 m)     Head Circumference --      Peak Flow --      Pain Score 03/30/17 1707 0     Pain Loc --      Pain Edu? --      Excl. in [place]? --     Vital signs reviewed, nursing assessments reviewed.   Constitutional:   Alert and orientedto self. Not in distresss. Eyes:   No scleral icterus. No conjunctival pallor. PERRL. EOMI.  No nystagmus. ENT   Head:   Normocephalic and atraumatic.   Nose:   No congestion/rhinnorhea. No septal  hematoma   Mouth/Throat:   Dry mucous membranes, no pharyngeal erythema. No peritonsillar mass.    Neck:   No stridor. No SubQ emphysema. No meningismus. Hematological/Lymphatic/Immunilogical:   No cervical lymphadenopathy. Cardiovascular:   Tachycardia heart rate 100. Symmetric bilateral radial and DP pulses.  No murmurs.  Respiratory:   Normal respiratory effort without tachypnea nor retractions. Breath sounds are clear and equal bilaterally. No wheezes/rales/rhonchi. Gastrointestinal:   Soft and nontender. Non distended. There is no CVA tenderness.  No rebound, rigidity, or guarding. Genitourinary:   deferred Musculoskeletal:   Normal range of motion in all extremities. No joint effusions.  No lower extremity tenderness.  No edema. Neurologic:   No verbal responses,no speech.  CN 2-10 normal. Motor grossly intact. No gross focal neurologic deficits are appreciated.  Skin:    Skin is warm, dry and intact. No rash noted.  No petechiae, purpura, or bullae.poor skin turgor  ____________________________________________    LABS (pertinent positives/negatives) (all labs ordered are listed, but only abnormal results are displayed) Labs Reviewed  COMPREHENSIVE METABOLIC PANEL - Abnormal; Notable for the following:       Result Value   Glucose, Bld 117 (*)    Calcium 8.8 (*)    ALT 12 (*)    GFR calc non Af Amer 50 (*)    GFR calc Af Amer 58 (*)    All other components within normal limits  CBC WITH DIFFERENTIAL/PLATELET - Abnormal; Notable for the following:    RBC 3.97 (*)    Hemoglobin 12.1 (*)    HCT 36.0 (*)    RDW 15.7 (*)    Platelets 136 (*)    Neutro Abs 7.3 (*)    Lymphs Abs 0.7 (*)    All other components within normal limits  URINALYSIS, COMPLETE (UACMP) WITH MICROSCOPIC - Abnormal; Notable for the following:    Color, Urine YELLOW (*)    APPearance CLEAR (*)    All other components within normal limits  CULTURE, BLOOD (ROUTINE X 2)  CULTURE, BLOOD (ROUTINE X 2)   URINE CULTURE  LACTIC ACID, PLASMA  LIPASE, BLOOD  TROPONIN I  APTT  PROTIME-INR  LACTIC ACID, PLASMA   ____________________________________________   EKG    ____________________________________________    RADIOLOGY  Ct Head Wo Contrast  Result Date: 03/30/2017 CLINICAL DATA:  Altered mental status.  Fall EXAM: CT HEAD WITHOUT CONTRAST TECHNIQUE: Contiguous axial images were obtained from the base of the skull through the vertex without intravenous contrast. COMPARISON:  02/28/2017 FINDINGS: Brain: Moderate atrophy. Chronic white matter changes. Negative for acute infarct, hemorrhage, or mass lesion. Vascular: Negative for hyperdense vessel Skull: Negative for fracture. Sinuses/Orbits: Negative Other: None IMPRESSION: Moderate atrophy with chronic white matter changes. No acute  abnormality. Electronically Signed   By: Franchot Gallo M.D.   On: 03/30/2017 19:25   Dg Chest Port 1 View  Result Date: 03/30/2017 CLINICAL DATA:  Unwitnessed fall EXAM: PORTABLE CHEST 1 VIEW COMPARISON:  Portable exam 1729 hours compared 02/25/2017 FINDINGS: Normal heart size, mediastinal contours, and pulmonary vascularity. Mild RIGHT basilar atelectasis. Lungs otherwise clear. No pleural effusion or pneumothorax. Bones demineralized. Spur formation at inferior RIGHT glenoid rim. IMPRESSION: Mild RIGHT basilar atelectasis. Electronically Signed   By: Lavonia Dana M.D.   On: 03/30/2017 17:43    ____________________________________________   PROCEDURES Procedures CRITICAL CARE Performed by: Joni Fears, Loreal Schuessler   Total critical care time: 35 minutes  Critical care time was exclusive of separately billable procedures and treating other patients.  Critical care was necessary to treat or prevent imminent or life-threatening deterioration.  Critical care was time spent personally by me on the following activities: development of treatment plan with patient and/or surrogate as well as nursing, discussions  with consultants, evaluation of patient's response to treatment, examination of patient, obtaining history from patient or surrogate, ordering and performing treatments and interventions, ordering and review of laboratory studies, ordering and review of radiographic studies, pulse oximetry and re-evaluation of patient's condition.  ____________________________________________   INITIAL IMPRESSION / ASSESSMENT AND PLAN / ED COURSE  Pertinent labs & imaging results that were available during my care of the patient were reviewed by me and considered in my medical decision making (see chart for details).    Clinical Course as of Mar 31 2119  Thu Mar 30, 2017  1716 Pt p/w fever and altered mental status.  Code sepsis initiated. Likely recurrent UTI.   [PS]  1744 UA neg. Will check ct heaed. F/u cxr and labs.   [PS]    Clinical Course User Index [PS] Carrie Mew, MD     ----------------------------------------- 9:19 PM on 03/30/2017 -----------------------------------------  Workup overall reassuring.  Cell count normal. Lactic acid level normal. CT head unremarkable. Chest x-ray shows an area described as atelectasis by radiology, but in this clinical context I suspect that it is actually pneumonia. Given his altered mental status, unsteadiness on his feet, and presentation with fever and tachycardia, I don't feel that he is suitable for outpatient management at this time. He is at high risk for decompensation and requiring additional medical interventions. Case discussed with hospitalist for further management.  ____________________________________________   FINAL CLINICAL IMPRESSION(S) / ED DIAGNOSES  Final diagnoses:  Community acquired pneumonia, unspecified laterality  Fever, unspecified fever cause  Delirium      New Prescriptions   No medications on file     Portions of this note were generated with dragon dictation software. Dictation errors may occur despite  best attempts at proofreading.    Carrie Mew, MD 03/30/17 2120

## 2017-03-31 LAB — CBC
HCT: 33.6 % — ABNORMAL LOW (ref 40.0–52.0)
Hemoglobin: 11.6 g/dL — ABNORMAL LOW (ref 13.0–18.0)
MCH: 31.2 pg (ref 26.0–34.0)
MCHC: 34.4 g/dL (ref 32.0–36.0)
MCV: 90.7 fL (ref 80.0–100.0)
PLATELETS: 129 10*3/uL — AB (ref 150–440)
RBC: 3.71 MIL/uL — AB (ref 4.40–5.90)
RDW: 15.4 % — AB (ref 11.5–14.5)
WBC: 8.9 10*3/uL (ref 3.8–10.6)

## 2017-03-31 LAB — URINE CULTURE: CULTURE: NO GROWTH

## 2017-03-31 LAB — BASIC METABOLIC PANEL
Anion gap: 6 (ref 5–15)
BUN: 17 mg/dL (ref 6–20)
CALCIUM: 8.2 mg/dL — AB (ref 8.9–10.3)
CO2: 27 mmol/L (ref 22–32)
CREATININE: 1.34 mg/dL — AB (ref 0.61–1.24)
Chloride: 103 mmol/L (ref 101–111)
GFR calc non Af Amer: 43 mL/min — ABNORMAL LOW (ref >60)
GFR, EST AFRICAN AMERICAN: 50 mL/min — AB (ref >60)
Glucose, Bld: 107 mg/dL — ABNORMAL HIGH (ref 65–99)
Potassium: 3.5 mmol/L (ref 3.5–5.1)
Sodium: 136 mmol/L (ref 135–145)

## 2017-03-31 LAB — MRSA PCR SCREENING: MRSA by PCR: NEGATIVE

## 2017-03-31 LAB — LACTIC ACID, PLASMA: Lactic Acid, Venous: 1.5 mmol/L (ref 0.5–1.9)

## 2017-03-31 MED ORDER — HALOPERIDOL LACTATE 5 MG/ML IJ SOLN
1.0000 mg | Freq: Four times a day (QID) | INTRAMUSCULAR | Status: DC | PRN
  Administered 2017-03-31 – 2017-04-01 (×3): 1 mg via INTRAVENOUS
  Filled 2017-03-31 (×4): qty 1

## 2017-03-31 MED ORDER — RISPERIDONE 0.5 MG PO TBDP
0.2500 mg | ORAL_TABLET | Freq: Two times a day (BID) | ORAL | Status: DC | PRN
  Administered 2017-03-31 – 2017-04-01 (×2): 0.25 mg via ORAL
  Filled 2017-03-31 (×4): qty 0.5

## 2017-03-31 MED ORDER — AMLODIPINE BESYLATE 5 MG PO TABS
5.0000 mg | ORAL_TABLET | Freq: Every day | ORAL | Status: DC
  Administered 2017-03-31: 5 mg via ORAL
  Filled 2017-03-31: qty 1

## 2017-03-31 MED ORDER — TAMSULOSIN HCL 0.4 MG PO CAPS
0.4000 mg | ORAL_CAPSULE | Freq: Every day | ORAL | Status: DC
  Administered 2017-03-31: 14:00:00 0.4 mg via ORAL
  Filled 2017-03-31: qty 1

## 2017-03-31 MED ORDER — KCL IN DEXTROSE-NACL 20-5-0.45 MEQ/L-%-% IV SOLN
INTRAVENOUS | Status: DC
  Administered 2017-03-31 – 2017-04-01 (×2): via INTRAVENOUS
  Filled 2017-03-31 (×3): qty 1000

## 2017-03-31 MED ORDER — ADULT MULTIVITAMIN W/MINERALS CH
1.0000 | ORAL_TABLET | Freq: Every day | ORAL | Status: DC
  Administered 2017-03-31: 13:00:00 1 via ORAL
  Filled 2017-03-31: qty 1

## 2017-03-31 MED ORDER — ENSURE ENLIVE PO LIQD
237.0000 mL | Freq: Three times a day (TID) | ORAL | Status: DC
  Administered 2017-03-31 – 2017-04-01 (×3): 237 mL via ORAL

## 2017-03-31 NOTE — Progress Notes (Addendum)
[facility] [place] NAME: Aaron Ellis    MR#:  [id number]  DATE OF BIRTH:  06-25-1919  SUBJECTIVE:  CHIEF COMPLAINT:   Chief Complaint  Patient presents with  . Fall  . Fever   Patient is a 83 year old Caucasian male with past medical history significant for history of hypertension, Alzheimer's dementia, BPH, who was brought from [place] with fever, confusion, fall, cough. In emergency room, a chest x-ray was concerning for right base pneumonia, patient was admitted. He was initiated on broad-spectrum antibiotic therapy including Zosyn and vancomycin. He denies any significant discomfort, remains confused Review of Systems  Unable to perform ROS: Mental status change    VITAL SIGNS: Blood pressure (!) 174/76, pulse 63, temperature 97.8 F (36.6 C), temperature source Oral, resp. rate 16, height 5\' 8"  (1.727 m), weight 73.3 kg (161 lb 11.2 oz), SpO2 96 %.  PHYSICAL EXAMINATION:   GENERAL:  83 y.o.-year-old patient sitting in the bed with no acute distress, relatively comfortable, confused.  EYES: Pupils equal, round, reactive to light and accommodation. No scleral icterus. Extraocular muscles intact.  HEENT: Head atraumatic, normocephalic. Oropharynx and nasopharynx clear. Dry oral mucosa NECK:  Supple, no jugular venous distention. No thyroid enlargement, no tenderness.  LUNGS: Normal breath sounds bilaterally, no wheezing, rales,rhonchi or crepitation. No use of accessory muscles of respiration.  CARDIOVASCULAR: S1, S2 normal. No murmurs, rubs, or gallops.  ABDOMEN: Tight. No significant tenderness was noted on palpation, nontender, some distended. Bowel sounds present. No organomegaly or mass.  EXTREMITIES: No pedal edema, cyanosis, or clubbing.  NEUROLOGIC: Cranial nerves II through XII are intact. Muscle strength 5/5 in all extremities. Sensation intact. Gait not checked.  PSYCHIATRIC: The patient is alert and  confused.  SKIN: No obvious rash, lesion, or ulcer.   ORDERS/RESULTS REVIEWED:   CBC  Recent Labs Lab 03/30/17 1712 03/31/17 0439  WBC 8.8 8.9  HGB 12.1* 11.6*  HCT 36.0* 33.6*  PLT 136* 129*  MCV 90.5 90.7  MCH 30.6 31.2  MCHC 33.8 34.4  RDW 15.7* 15.4*  LYMPHSABS 0.7*  --   MONOABS 0.7  --   EOSABS 0.1  --   BASOSABS 0.0  --    ------------------------------------------------------------------------------------------------------------------  Chemistries   Recent Labs Lab 03/30/17 1712 03/31/17 0439  NA 136 136  K 3.8 3.5  CL 102 103  CO2 27 27  GLUCOSE 117* 107*  BUN 15 17  CREATININE 1.19 1.34*  CALCIUM 8.8* 8.2*  AST 21  --   ALT 12*  --   ALKPHOS 79  --   BILITOT 0.6  --    ------------------------------------------------------------------------------------------------------------------ estimated creatinine clearance is 31.9 mL/min (A) (by C-G formula based on SCr of 1.34 mg/dL (H)). ------------------------------------------------------------------------------------------------------------------ No results for input(s): TSH, T4TOTAL, T3FREE, THYROIDAB in the last 72 hours.  Invalid input(s): FREET3  Cardiac Enzymes  Recent Labs Lab 03/30/17 1712  TROPONINI <0.03   ------------------------------------------------------------------------------------------------------------------ Invalid input(s): POCBNP ---------------------------------------------------------------------------------------------------------------  RADIOLOGY: Ct Head Wo Contrast  Result Date: 03/30/2017 CLINICAL DATA:  Altered mental status.  Fall EXAM: CT HEAD WITHOUT CONTRAST TECHNIQUE: Contiguous axial images were obtained from the base of the skull through the vertex without intravenous contrast. COMPARISON:  02/28/2017 FINDINGS: Brain: Moderate atrophy. Chronic white matter changes. Negative for acute infarct, hemorrhage, or mass lesion. Vascular: Negative for hyperdense vessel  Skull: Negative for fracture. Sinuses/Orbits: Negative Other: None IMPRESSION: Moderate atrophy with chronic white matter changes. No acute abnormality. Electronically Signed  By: Franchot Gallo M.D.   On: 03/30/2017 19:25   Dg Chest Port 1 View  Result Date: 03/30/2017 CLINICAL DATA:  Unwitnessed fall EXAM: PORTABLE CHEST 1 VIEW COMPARISON:  Portable exam 1729 hours compared 02/25/2017 FINDINGS: Normal heart size, mediastinal contours, and pulmonary vascularity. Mild RIGHT basilar atelectasis. Lungs otherwise clear. No pleural effusion or pneumothorax. Bones demineralized. Spur formation at inferior RIGHT glenoid rim. IMPRESSION: Mild RIGHT basilar atelectasis. Electronically Signed   By: Lavonia Dana M.D.   On: 03/30/2017 17:43    EKG:  Orders placed or performed during the hospital encounter of 03/30/17  . EKG 12-Lead  . EKG 12-Lead    ASSESSMENT AND PLAN:  Active Problems:   Pneumonia #1. Right lower lobe Pneumonia, continue patient on Zosyn therapy, get sputum cultures if possible, blood cultures are pending, MRSA PCR is negative, discontinue vancomycin, follow clinically #2. Malignant essential hypertension, initiate patient on Norvasc, follow blood pressure readings closely #3 metabolic encephalopathy, supportive therapy with Risperdal as needed, CT of head was unremarkable, all changes #4 acute renal insufficiency, initiate patient on low rate IV fluids, follow creatinine in the morning, gets bladder scan #5 from thrombocytopenia, stable #6. Acute urinary retention, place Foley catheter. Bladder Scan was above 800,initiate patient on Flomax, finasteride  Management plans discussed with the patient, family and they are in agreement.   DRUG ALLERGIES:  Allergies  Allergen Reactions  . Corn Starch   . Corn-Containing Products     CODE STATUS:     Code Status Orders        Start     Ordered   03/30/17 2334  Do not attempt resuscitation (DNR)  Continuous    Question  Answer Comment  In the event of cardiac or respiratory ARREST Do not call a "code blue"   In the event of cardiac or respiratory ARREST Do not perform Intubation, CPR, defibrillation or ACLS   In the event of cardiac or respiratory ARREST Use medication by any route, position, wound care, and other measures to relive pain and suffering. May use oxygen, suction and manual treatment of airway obstruction as needed for comfort.      03/30/17 2333    Code Status History    Date Active Date Inactive Code Status Order ID Comments User Context   04/14/2016  2:02 PM 04/18/2016  7:06 PM DNR [id number]  Fritzi Mandes, MD Inpatient    Advance Directive Documentation     Most Recent Value  Type of Advance Directive  Out of facility DNR (pink MOST or yellow form)  Pre-existing out of facility DNR order (yellow form or pink MOST form)  -  "MOST" Form in Place?  -      TOTAL TIME TAKING CARE OF THIS PATIENT: 40 minutes.    Theodoro Grist M.D on 03/31/2017 at 1:39 PM  Between 7am to 6pm - Pager - [phone]  After 6pm go to [email or web address] - password [email or web address] [facility]  Office  [phone]  CC: Primary care physician; Velta Addison, Claretha Cooper, DO

## 2017-03-31 NOTE — Progress Notes (Signed)
Initial Nutrition Assessment  DOCUMENTATION CODES:   Severe malnutrition in context of chronic illness  INTERVENTION:  Will downgrade patient to Dysphagia 2 diet with thin liquids.   Recommend SLP evaluation as patient is at risk of aspiration.  Provide Ensure Enlive po TID with meals, each supplement provides 350 kcal and 20 grams of protein.   Recommend multivitamin with minerals daily.  NUTRITION DIAGNOSIS:   Malnutrition (Severe) related to chronic illness (Alzheimer's dementia, hx subdural hematoma) as evidenced by severe depletion of body fat, severe depletion of muscle mass.  GOAL:   Patient will meet greater than or equal to 90% of their needs  MONITOR:   PO intake, Supplement acceptance, Labs, Weight trends, I & O's  REASON FOR ASSESSMENT:   Consult Assessment of nutrition requirement/status  ASSESSMENT:   83 year old male with PMHx of HTN, Alzheimer's dementia, and benign neoplasm of prostate presents from [facility] due to fever, confusion, and fall found to have PNA, acute metabolic encephalopathy.   Patient unable to participate in assessment due to encephalopathy. Spoke with his daughter at bedside. She reports patient has had a poor appetite for a while now. He became very sick in January at [facility] and had pneumonia. At that time he lost 19 lbs in less than 1 month. Since then she reports he started eating a little bit better, but he mainly only eats bites of meals. He does not like the food at [facility], which makes it difficult for him to eat. He will drink at least 3 Mighty Shakes per day there and finishes all of them. He also enjoys Ensure and will drink them (likes chocolate, vanilla, or strawberry). She reports he does not like to drink many other liquids. Daughter reports that last May patient fell and had some bleeding on his brain. He also broke his dentures around the same time. Since then he has difficulty chewing and occasionally holds  food in his mouth. She reports she has requested a chopped diet at [facility], but so far they have kept him on regular texture diet with thin liquids. She also endorses occasional coughing with drinking liquids, but reports he normally can swallow liquids okay.   Reports patient was 170 lbs prior to fall in May. He has lost 8.3 lbs (4.9% body weight) over 1 year, which is not significant for time frame. The reported wight loss of 19 lbs over 1 month in January is not evident per weights in chart, if current weight is accurate. Current weight may not be accurate as patient does not appear to weigh 161 lbs on physical examination. Reports patient used to weigh >200 lbs several years ago.  Medications reviewed and include: folic acid 1 mg daily, levothyroxine, D5-1/2NS with KCl 20 mEq/L @ 50 ml/hr (60 grams dextrose, 204 kcal daily), Zosyn, vancomycin.  Labs reviewed: Creatinine 1.34.   Nutrition-Focused physical exam completed. Findings are severe fat depletion, moderate-severe muscle depletion, and no edema.   Discussed with RN. Patient did not have any trouble swallowing pills with water this morning.   Diet Order:  Diet Heart Room service appropriate? Yes; Fluid consistency: Thin  Skin:  Reviewed, no issues  Last BM:  03/31/2017 - type 5 per chart  Height:   Ht Readings from Last 1 Encounters:  03/30/17 5\' 8"  (1.727 m)    Weight:   Wt Readings from Last 1 Encounters:  03/30/17 161 lb 11.2 oz (73.3 kg)    Ideal Body Weight:  70  kg  BMI:  Body mass index is 24.59 kg/m.  Estimated Nutritional Needs:   Kcal:  1620-1890 (MSJ x 1.2-1.4)  Protein:  95-110 grams (1.3-1.5 grams/kg)  Fluid:  1.8 L/day (25 ml/kg)  EDUCATION NEEDS:   Education needs no appropriate at this time  Willey Blade, MS, RD, LDN Pager: [phone] After Hours Pager: [phone]

## 2017-04-01 LAB — CBC
HCT: 35.6 % — ABNORMAL LOW (ref 40.0–52.0)
Hemoglobin: 11.7 g/dL — ABNORMAL LOW (ref 13.0–18.0)
MCH: 29.5 pg (ref 26.0–34.0)
MCHC: 32.8 g/dL (ref 32.0–36.0)
MCV: 89.8 fL (ref 80.0–100.0)
Platelets: 132 10*3/uL — ABNORMAL LOW (ref 150–440)
RBC: 3.97 MIL/uL — ABNORMAL LOW (ref 4.40–5.90)
RDW: 15.2 % — AB (ref 11.5–14.5)
WBC: 8.8 10*3/uL (ref 3.8–10.6)

## 2017-04-01 LAB — BASIC METABOLIC PANEL
Anion gap: 7 (ref 5–15)
BUN: 20 mg/dL (ref 6–20)
CHLORIDE: 107 mmol/L (ref 101–111)
CO2: 27 mmol/L (ref 22–32)
Calcium: 8.9 mg/dL (ref 8.9–10.3)
Creatinine, Ser: 1.64 mg/dL — ABNORMAL HIGH (ref 0.61–1.24)
GFR calc Af Amer: 39 mL/min — ABNORMAL LOW (ref >60)
GFR calc non Af Amer: 34 mL/min — ABNORMAL LOW (ref >60)
GLUCOSE: 118 mg/dL — AB (ref 65–99)
POTASSIUM: 4 mmol/L (ref 3.5–5.1)
Sodium: 141 mmol/L (ref 135–145)

## 2017-04-01 LAB — STREP PNEUMONIAE URINARY ANTIGEN: STREP PNEUMO URINARY ANTIGEN: NEGATIVE

## 2017-04-01 MED ORDER — SODIUM CHLORIDE 0.9 % IV SOLN: 250.0000 mL | INTRAVENOUS | Status: DC | PRN

## 2017-04-01 MED ORDER — SODIUM CHLORIDE 0.9% FLUSH
3.0000 mL | Freq: Two times a day (BID) | INTRAVENOUS | Status: DC
  Administered 2017-04-01 – 2017-04-04 (×6): 3 mL via INTRAVENOUS

## 2017-04-01 MED ORDER — HALOPERIDOL LACTATE 5 MG/ML IJ SOLN
0.5000 mg | INTRAMUSCULAR | Status: DC | PRN
  Administered 2017-04-03: 0.5 mg via INTRAVENOUS
  Filled 2017-04-01: qty 1

## 2017-04-01 MED ORDER — MORPHINE SULFATE (CONCENTRATE) 10 MG/0.5ML PO SOLN: 5.0000 mg | ORAL | Status: DC | PRN

## 2017-04-01 MED ORDER — MORPHINE SULFATE (PF) 2 MG/ML IV SOLN
2.0000 mg | INTRAVENOUS | Status: DC | PRN
  Administered 2017-04-03 – 2017-04-04 (×4): 2 mg via INTRAVENOUS
  Filled 2017-04-01 (×4): qty 1

## 2017-04-01 MED ORDER — SODIUM CHLORIDE 0.9% FLUSH: 3.0000 mL | INTRAVENOUS | Status: DC | PRN

## 2017-04-01 MED ORDER — LORAZEPAM 1 MG PO TABS: 1.0000 mg | ORAL_TABLET | ORAL | Status: DC | PRN

## 2017-04-01 MED ORDER — HALOPERIDOL 0.5 MG PO TABS: 0.5000 mg | ORAL_TABLET | ORAL | Status: DC | PRN

## 2017-04-01 MED ORDER — LORAZEPAM 2 MG/ML IJ SOLN
1.0000 mg | INTRAMUSCULAR | Status: DC | PRN
  Administered 2017-04-02 – 2017-04-03 (×3): 1 mg via INTRAVENOUS
  Filled 2017-04-01 (×3): qty 1

## 2017-04-01 MED ORDER — LORAZEPAM 2 MG/ML PO CONC
1.0000 mg | ORAL | Status: DC | PRN
  Administered 2017-04-04: 1 mg via SUBLINGUAL
  Filled 2017-04-01: qty 1

## 2017-04-01 MED ORDER — ACETAMINOPHEN 325 MG PO TABS: 650.0000 mg | ORAL_TABLET | Freq: Four times a day (QID) | ORAL | Status: DC | PRN

## 2017-04-01 MED ORDER — ACETAMINOPHEN 650 MG RE SUPP: 650.0000 mg | Freq: Four times a day (QID) | RECTAL | Status: DC | PRN

## 2017-04-01 MED ORDER — HALOPERIDOL LACTATE 2 MG/ML PO CONC
0.5000 mg | ORAL | Status: DC | PRN
  Filled 2017-04-01: qty 0.3

## 2017-04-01 MED ORDER — MORPHINE SULFATE (CONCENTRATE) 10 MG/0.5ML PO SOLN
5.0000 mg | ORAL | Status: DC | PRN
  Administered 2017-04-04: 11:00:00 5 mg via ORAL
  Filled 2017-04-01: qty 1

## 2017-04-01 NOTE — Plan of Care (Signed)
Problem: Safety: Goal: Ability to remain free from injury will improve Outcome: Not Progressing Pt with increase in friction related skin damage due to restlessness.  Pt given haldol IV every 6 hours and Risperdal with no change.  Skin care and foam protection provided to elbows, arms, heels and sacrum.

## 2017-04-01 NOTE — Clinical Social Work Note (Signed)
CSW saw through chart review that the patient was admitted from [facility]. According to the RN, the patient may become comfort care over the weekend. The CSW will follow for care coordination with a full assessment to follow once the family makes a goals of care decision.  Santiago Bumpers, MSW, Latanya Presser [phone]

## 2017-04-01 NOTE — Progress Notes (Addendum)
Pt has not slept all night.  Pt continues to fidget and pull at foley and IV line.  Line covered, foley secured with leg strap. Music playing, lights low.  Frequent observation.  Haldol given.  Once quieter, will attempt to place foam on elbows and ankles as he is rubbing skin red with so much movement. Dorna Bloom RN [phone] Pt continues to be restless.  No change.  Applied foam dressings to elbows, arms, heels, sacrum, and new area of abrasion to left lateral ankle.  Padded side rails with pillows and pt has safety mitts on but constantly moving and kicking in bed. Dorna Bloom RN

## 2017-04-01 NOTE — Progress Notes (Signed)
Advanced care plan.  Purpose of the Encounter:  Goal of care   Parties in [place] and daughter Carolee Rota  Patient's Decision Capacity: Not intact  Subjective/Patient's story: Pt is 83 y.o with advanced dementia admitted with pna.     Objective/Medical story: Patient admitted with pneumonia and no significant improvement since admission, I have discussed with patient's daughter. She reports that patient has a living will and she has healthcare power of attorney. Her father also has expressed that he did not want any aggressive therapy.    Goals of care determination: At this point we will initiate comfort care orders discontinue all labs antibiotics and other nonessential medications.       Time spent discussing advanced care planning: 15 minutes

## 2017-04-01 NOTE — Progress Notes (Signed)
[place] [place] NAME: Aaron Ellis    MR#:  [id number]  DATE OF BIRTH:  01-Feb-1919  SUBJECTIVE:  CHIEF COMPLAINT:   Chief Complaint  Patient presents with  . Fall  . Fever   Patient is a 83 year old Caucasian male with past medical history significant for history of hypertension, Alzheimer's dementia, BPH, who was brought from [place] with fever, confusion, fall, cough. In emergency room, a chest x-ray was concerning for right base pneumonia, patient was admitted. Patient with no Significant improvement. Daughter at bedside. Patient unable to provide any review of systems      Review of Systems  Unable to perform ROS: Mental status change    VITAL SIGNS: Blood pressure 135/86, pulse (!) 106, temperature (!) 96.4 F (35.8 C), temperature source Axillary, resp. rate 20, height 5\' 8"  (1.727 m), weight 161 lb 11.2 oz (73.3 kg), SpO2 100 %.  PHYSICAL EXAMINATION:   GENERAL:  83 y.o.-year-old patient sitting in the bed with no acute distress, relatively comfortable, currently lethargic EYES: Pupils equal, round, reactive to light and accommodation. No scleral icterus. Extraocular muscles intact.  HEENT: Head atraumatic, normocephalic. Oropharynx and nasopharynx clear. Dry oral mucosa NECK:  Supple, no jugular venous distention. No thyroid enlargement, no tenderness.  LUNGS: Normal breath sounds bilaterally, no wheezing, rales,rhonchi or crepitation. No use of accessory muscles of respiration.  CARDIOVASCULAR: S1, S2 normal. No murmurs, rubs, or gallops.  ABDOMEN: Tight. No significant tenderness was noted on palpation, nontender, some distended. Bowel sounds present. No organomegaly or mass.  EXTREMITIES: No pedal edema, cyanosis, or clubbing.  NEUROLOGIC: Not able to follow, commands PSYCHIATRIC: The patient is drowsy SKIN: No obvious rash, lesion, or ulcer.   ORDERS/RESULTS REVIEWED:   CBC  Recent Labs Lab  03/30/17 1712 03/31/17 0439 04/01/17 0443  WBC 8.8 8.9 8.8  HGB 12.1* 11.6* 11.7*  HCT 36.0* 33.6* 35.6*  PLT 136* 129* 132*  MCV 90.5 90.7 89.8  MCH 30.6 31.2 29.5  MCHC 33.8 34.4 32.8  RDW 15.7* 15.4* 15.2*  LYMPHSABS 0.7*  --   --   MONOABS 0.7  --   --   EOSABS 0.1  --   --   BASOSABS 0.0  --   --    ------------------------------------------------------------------------------------------------------------------  Chemistries   Recent Labs Lab 03/30/17 1712 03/31/17 0439 04/01/17 0443  NA 136 136 141  K 3.8 3.5 4.0  CL 102 103 107  CO2 27 27 27   GLUCOSE 117* 107* 118*  BUN 15 17 20   CREATININE 1.19 1.34* 1.64*  CALCIUM 8.8* 8.2* 8.9  AST 21  --   --   ALT 12*  --   --   ALKPHOS 79  --   --   BILITOT 0.6  --   --    ------------------------------------------------------------------------------------------------------------------ estimated creatinine clearance is 26.1 mL/min (A) (by C-G formula based on SCr of 1.64 mg/dL (H)). ------------------------------------------------------------------------------------------------------------------ No results for input(s): TSH, T4TOTAL, T3FREE, THYROIDAB in the last 72 hours.  Invalid input(s): FREET3  Cardiac Enzymes  Recent Labs Lab 03/30/17 1712  TROPONINI <0.03   ------------------------------------------------------------------------------------------------------------------ Invalid input(s): POCBNP ---------------------------------------------------------------------------------------------------------------  RADIOLOGY: Ct Head Wo Contrast  Result Date: 03/30/2017 CLINICAL DATA:  Altered mental status.  Fall EXAM: CT HEAD WITHOUT CONTRAST TECHNIQUE: Contiguous axial images were obtained from the base of the skull through the vertex without intravenous contrast. COMPARISON:  02/28/2017 FINDINGS: Brain: Moderate atrophy. Chronic white matter changes. Negative for acute infarct, hemorrhage, or  mass lesion.  Vascular: Negative for hyperdense vessel Skull: Negative for fracture. Sinuses/Orbits: Negative Other: None IMPRESSION: Moderate atrophy with chronic white matter changes. No acute abnormality. Electronically Signed   By: Franchot Gallo M.D.   On: 03/30/2017 19:25   Dg Chest Port 1 View  Result Date: 03/30/2017 CLINICAL DATA:  Unwitnessed fall EXAM: PORTABLE CHEST 1 VIEW COMPARISON:  Portable exam 1729 hours compared 02/25/2017 FINDINGS: Normal heart size, mediastinal contours, and pulmonary vascularity. Mild RIGHT basilar atelectasis. Lungs otherwise clear. No pleural effusion or pneumothorax. Bones demineralized. Spur formation at inferior RIGHT glenoid rim. IMPRESSION: Mild RIGHT basilar atelectasis. Electronically Signed   By: Lavonia Dana M.D.   On: 03/30/2017 17:43    EKG:  Orders placed or performed during the hospital encounter of 03/30/17  . EKG 12-Lead  . EKG 12-Lead    ASSESSMENT AND PLAN:  Active Problems:   Pneumonia #1. Right lower lobe Pneumonia, I had lengthy discussion with the daughter we will transition him to comfort care #2. Malignant essential hypertension, comfort care measures only  #3 metabolic encephalopathy, no significant improvement supportive care  #4 acute renal insufficiency,  stop IV fluids #5 thrombocytopenia, stable no further lab draws  #6. Acute urinary retention,  continue Foley for comfort measures Management plans discussed with the patient, family and they are in agreement.   DRUG ALLERGIES:  Allergies  Allergen Reactions  . Corn Starch   . Corn-Containing Products     CODE STATUS:     Code Status Orders        Start     Ordered   03/30/17 2334  Do not attempt resuscitation (DNR)  Continuous    Question Answer Comment  In the event of cardiac or respiratory ARREST Do not call a "code blue"   In the event of cardiac or respiratory ARREST Do not perform Intubation, CPR, defibrillation or ACLS   In the event of cardiac or respiratory  ARREST Use medication by any route, position, wound care, and other measures to relive pain and suffering. May use oxygen, suction and manual treatment of airway obstruction as needed for comfort.      03/30/17 2333    Code Status History    Date Active Date Inactive Code Status Order ID Comments User Context   04/14/2016  2:02 PM 04/18/2016  7:06 PM DNR [id number]  Fritzi Mandes, MD Inpatient    Advance Directive Documentation     Most Recent Value  Type of Advance Directive  Out of facility DNR (pink MOST or yellow form)  Pre-existing out of facility DNR order (yellow form or pink MOST form)  -  "MOST" Form in Place?  -      TOTAL TIME TAKING CARE OF THIS PATIENT: 35 minutes.   In coordinating care and making patient comfort care   Dustin Flock M.D on 04/01/2017 at 12:48 PM  Between 7am to 6pm - Pager - [phone]  After 6pm go to [email or web address] - password [email or web address] [facility]  Office  [phone]  CC: Primary care physician; Velta Addison, Claretha Cooper, DO

## 2017-04-02 NOTE — Progress Notes (Signed)
[facility] at [place] NAME: Aaron Ellis    MR#:  [id number]  DATE OF BIRTH:  1919-05-22  SUBJECTIVE:  CHIEF COMPLAINT:   Chief Complaint  Patient presents with  . Fall  . Fever   Patient is a 83 year old Caucasian male with past medical history significant for history of hypertension, Alzheimer's dementia, BPH, who was brought from [place] with fever, confusion, fall, cough. In emergency room, a chest x-ray was concerning for right base pneumonia, patient was admitted. Patient with no Significant improvement. Daughter at bedside. Patient unable to provide any review of systems      Review of Systems  Unable to perform ROS: Mental status change    VITAL SIGNS: Blood pressure (!) 155/77, pulse 77, temperature 97.5 F (36.4 C), temperature source Axillary, resp. rate (!) 23, height 5\' 8"  (1.727 m), weight 161 lb 11.2 oz (73.3 kg), SpO2 100 %.  PHYSICAL EXAMINATION:   GENERAL:  83 y.o.-year-old patient sitting in the bed with no acute distress, relatively comfortable, currently lethargic EYES: Pupils equal, round, reactive to light and accommodation. No scleral icterus. Extraocular muscles intact.  HEENT: Head atraumatic, normocephalic. Oropharynx and nasopharynx clear. Dry oral mucosa NECK:  Supple, no jugular venous distention. No thyroid enlargement, no tenderness.  LUNGS: Normal breath sounds bilaterally, no wheezing, rales,rhonchi or crepitation. No use of accessory muscles of respiration.  CARDIOVASCULAR: S1, S2 normal. No murmurs, rubs, or gallops.  ABDOMEN: Tight. No significant tenderness was noted on palpation, nontender, some distended. Bowel sounds present. No organomegaly or mass.  EXTREMITIES: No pedal edema, cyanosis, or clubbing.  NEUROLOGIC: Not able to follow, commands PSYCHIATRIC: The patient is drowsy SKIN: No obvious rash, lesion, or ulcer.   ORDERS/RESULTS REVIEWED:   CBC  Recent Labs Lab  03/30/17 1712 03/31/17 0439 04/01/17 0443  WBC 8.8 8.9 8.8  HGB 12.1* 11.6* 11.7*  HCT 36.0* 33.6* 35.6*  PLT 136* 129* 132*  MCV 90.5 90.7 89.8  MCH 30.6 31.2 29.5  MCHC 33.8 34.4 32.8  RDW 15.7* 15.4* 15.2*  LYMPHSABS 0.7*  --   --   MONOABS 0.7  --   --   EOSABS 0.1  --   --   BASOSABS 0.0  --   --    ------------------------------------------------------------------------------------------------------------------  Chemistries   Recent Labs Lab 03/30/17 1712 03/31/17 0439 04/01/17 0443  NA 136 136 141  K 3.8 3.5 4.0  CL 102 103 107  CO2 27 27 27   GLUCOSE 117* 107* 118*  BUN 15 17 20   CREATININE 1.19 1.34* 1.64*  CALCIUM 8.8* 8.2* 8.9  AST 21  --   --   ALT 12*  --   --   ALKPHOS 79  --   --   BILITOT 0.6  --   --    ------------------------------------------------------------------------------------------------------------------ estimated creatinine clearance is 26.1 mL/min (A) (by C-G formula based on SCr of 1.64 mg/dL (H)). ------------------------------------------------------------------------------------------------------------------ No results for input(s): TSH, T4TOTAL, T3FREE, THYROIDAB in the last 72 hours.  Invalid input(s): FREET3  Cardiac Enzymes  Recent Labs Lab 03/30/17 1712  TROPONINI <0.03   ------------------------------------------------------------------------------------------------------------------ Invalid input(s): POCBNP ---------------------------------------------------------------------------------------------------------------  RADIOLOGY: No results found.  EKG:  Orders placed or performed during the hospital encounter of 03/30/17  . EKG 12-Lead  . EKG 12-Lead    ASSESSMENT AND PLAN:  Active Problems:   Pneumonia #1. Right lower lobe Pneumonia, I had lengthy discussion with the daughter we will transition him to comfort care #2.  Malignant essential hypertension, comfort care measures only  #3 metabolic  encephalopathy, no significant improvement supportive care  #4 acute renal insufficiency,  stop IV fluids #5 thrombocytopenia, stable no further lab draws  #6. Acute urinary retention,  continue Foley for comfort measures Management plans discussed with the patient, family and they are in agreement.   DRUG ALLERGIES:  Allergies  Allergen Reactions  . Corn Starch   . Corn-Containing Products     CODE STATUS:     Code Status Orders        Start     Ordered   03/30/17 2334  Do not attempt resuscitation (DNR)  Continuous    Question Answer Comment  In the event of cardiac or respiratory ARREST Do not call a "code blue"   In the event of cardiac or respiratory ARREST Do not perform Intubation, CPR, defibrillation or ACLS   In the event of cardiac or respiratory ARREST Use medication by any route, position, wound care, and other measures to relive pain and suffering. May use oxygen, suction and manual treatment of airway obstruction as needed for comfort.      03/30/17 2333    Code Status History    Date Active Date Inactive Code Status Order ID Comments User Context   04/14/2016  2:02 PM 04/18/2016  7:06 PM DNR [id number]  Fritzi Mandes, MD Inpatient    Advance Directive Documentation     Most Recent Value  Type of Advance Directive  Out of facility DNR (pink MOST or yellow form)  Pre-existing out of facility DNR order (yellow form or pink MOST form)  -  "MOST" Form in Place?  -      TOTAL TIME TAKING CARE OF THIS PATIENT: 25 minutes.   Dustin Flock M.D on 04/02/2017 at 12:02 PM  Between 7am to 6pm - Pager - [phone]  After 6pm go to [email or web address] - password [email or web address] [facility]  Office  [phone]  CC: Primary care physician; Velta Addison, Claretha Cooper, DO

## 2017-04-02 NOTE — Clinical Social Work Note (Signed)
CSW aware that patient is being converted to comfort care. Currently, [facility] not have any beds available. CSW will alert their staff to assess the patient on 5/7. CSW is following.\  Santiago Bumpers, MSW, Latanya Presser [phone]

## 2017-04-03 NOTE — Progress Notes (Signed)
New hospice home referral received from [facility]. Hospital care team and family aware there is currently no bed availability. Patient placed on the hospice home waiting list. Patient information faxed to referral. Thank you. Flo Shanks RN,BSN, [facility], hospital liaison [phone] c

## 2017-04-03 NOTE — Progress Notes (Signed)
[facility] at [place] NAME: Aaron Ellis    MR#:  [id number]  DATE OF BIRTH:  12/07/1918  SUBJECTIVE:  CHIEF COMPLAINT:   Chief Complaint  Patient presents with  . Fall  . Fever   Patient currently comfortable after receiving morphine and and anti anxiolytics and daughter at bedside     Review of Systems  Unable to perform ROS: Mental status change    VITAL SIGNS: Blood pressure (!) 141/72, pulse 80, temperature 97.5 F (36.4 C), resp. rate 20, height 5\' 8"  (1.727 m), weight 161 lb 11.2 oz (73.3 kg), SpO2 96 %.  PHYSICAL EXAMINATION:   GENERAL:  83 y.o.-year-old patient sitting in the bed with no acute distress, relatively comfortable, currently lethargic EYES: Pupils equal, round, reactive to light and accommodation. No scleral icterus. Extraocular muscles intact.  HEENT: Head atraumatic, normocephalic. Oropharynx and nasopharynx clear. Dry oral mucosa NECK:  Supple, no jugular venous distention. No thyroid enlargement, no tenderness.  LUNGS: Normal breath sounds bilaterally, no wheezing, rales,rhonchi or crepitation. No use of accessory muscles of respiration.  CARDIOVASCULAR: S1, S2 normal. No murmurs, rubs, or gallops.  ABDOMEN: Tight. No significant tenderness was noted on palpation, nontender, some distended. Bowel sounds present. No organomegaly or mass.  EXTREMITIES: No pedal edema, cyanosis, or clubbing.  NEUROLOGIC: Not able to follow, commands PSYCHIATRIC: The patient is drowsy SKIN: No obvious rash, lesion, or ulcer.   ORDERS/RESULTS REVIEWED:   CBC  Recent Labs Lab 03/30/17 1712 03/31/17 0439 04/01/17 0443  WBC 8.8 8.9 8.8  HGB 12.1* 11.6* 11.7*  HCT 36.0* 33.6* 35.6*  PLT 136* 129* 132*  MCV 90.5 90.7 89.8  MCH 30.6 31.2 29.5  MCHC 33.8 34.4 32.8  RDW 15.7* 15.4* 15.2*  LYMPHSABS 0.7*  --   --   MONOABS 0.7  --   --   EOSABS 0.1  --   --   BASOSABS 0.0  --   --     ------------------------------------------------------------------------------------------------------------------  Chemistries   Recent Labs Lab 03/30/17 1712 03/31/17 0439 04/01/17 0443  NA 136 136 141  K 3.8 3.5 4.0  CL 102 103 107  CO2 27 27 27   GLUCOSE 117* 107* 118*  BUN 15 17 20   CREATININE 1.19 1.34* 1.64*  CALCIUM 8.8* 8.2* 8.9  AST 21  --   --   ALT 12*  --   --   ALKPHOS 79  --   --   BILITOT 0.6  --   --    ------------------------------------------------------------------------------------------------------------------ estimated creatinine clearance is 26.1 mL/min (A) (by C-G formula based on SCr of 1.64 mg/dL (H)). ------------------------------------------------------------------------------------------------------------------ No results for input(s): TSH, T4TOTAL, T3FREE, THYROIDAB in the last 72 hours.  Invalid input(s): FREET3  Cardiac Enzymes  Recent Labs Lab 03/30/17 1712  TROPONINI <0.03   ------------------------------------------------------------------------------------------------------------------ Invalid input(s): POCBNP ---------------------------------------------------------------------------------------------------------------  RADIOLOGY: No results found.  EKG:  Orders placed or performed during the hospital encounter of 03/30/17  . EKG 12-Lead  . EKG 12-Lead    ASSESSMENT AND PLAN:  Active Problems:   Pneumonia #1. Right lower lobe Pneumonia, confurt care, hospices eval #2. Malignant essential hypertension, comfort care measures only  #3 metabolic encephalopathy, no significant improvement supportive care  #4 acute renal insufficiency, supportive care #5 thrombocytopenia, stable no further lab draws  #6. Acute urinary retention,  continue Foley for comfort measures Management plans discussed with the patient, family and they are in agreement.   DRUG ALLERGIES:  Allergies  Allergen  Reactions  . Corn Starch   .  Corn-Containing Products     CODE STATUS:     Code Status Orders        Start     Ordered   03/30/17 2334  Do not attempt resuscitation (DNR)  Continuous    Question Answer Comment  In the event of cardiac or respiratory ARREST Do not call a "code blue"   In the event of cardiac or respiratory ARREST Do not perform Intubation, CPR, defibrillation or ACLS   In the event of cardiac or respiratory ARREST Use medication by any route, position, wound care, and other measures to relive pain and suffering. May use oxygen, suction and manual treatment of airway obstruction as needed for comfort.      03/30/17 2333    Code Status History    Date Active Date Inactive Code Status Order ID Comments User Context   04/14/2016  2:02 PM 04/18/2016  7:06 PM DNR [id number]  Fritzi Mandes, MD Inpatient    Advance Directive Documentation     Most Recent Value  Type of Advance Directive  Out of facility DNR (pink MOST or yellow form)  Pre-existing out of facility DNR order (yellow form or pink MOST form)  -  "MOST" Form in Place?  -      TOTAL TIME TAKING CARE OF THIS PATIENT: 25 minutes.   Dustin Flock M.D on 04/03/2017 at 1:13 PM  Between 7am to 6pm - Pager - [phone]  After 6pm go to [email or web address] - password [email or web address] [facility]  Office  [phone]  CC: Primary care physician; Velta Addison, Claretha Cooper, DO

## 2017-04-03 NOTE — Care Management Important Message (Signed)
Important Message  Patient Details  Name: Aaron Ellis MRN: [id number] Date of Birth: 02/01/1919   Medicare Important Message Given:  Yes    Shelbie Ammons, RN 04/03/2017, 10:41 AM

## 2017-04-03 NOTE — Clinical Social Work Note (Signed)
Clinical Social Work Assessment  Patient Details  Name: Aaron Ellis MRN: [id number] Date of Birth: 1919/05/06  Date of referral:  04/03/17               Reason for consult:  Facility Placement, Discharge Planning                Permission sought to share information with:  Family Supports Permission granted to share information::  Yes, Verbal Permission Granted  Name::     Shaune Pollack  Relationship::  daughter  Contact Information:  [phone]  Housing/Transportation Living arrangements for the past 2 months:  [place] of Information:  Adult Children Patient Interpreter Needed:  None Criminal Activity/Legal Involvement Pertinent to Current Situation/Hospitalization:  No - Comment as needed Significant Relationships:  Adult Children Lives with:  Self Do you feel safe going back to the place where you live?  Yes Need for family participation in patient care:  No (Coment)  Care giving concerns:  No care giving concerns identified.   Social Worker assessment / plan:  CSW spoke with pt's daughter to address consult. CSW introduced herself and explained role of social work. CSW also explained process of discharging to a residential hospice facility. Pt has been made comfort care. CSW offered choice. Pt's daughter chose [facility]. Pt's daughter is not sure if he is stable for transfer. CSW made referral. CSW will continue follow.   Employment status:  Retired Nurse, adult PT Recommendations:  Not assessed at this time Information / Referral to community resources:  Other (Comment Required) (Residential Hospice)  Patient/Family's Response to care:  Pt's daughter was appreciative of CSW support.   Patient/Family's Understanding of and Emotional Response to Diagnosis, Current Treatment, and Prognosis:  Pt's daughter understands that pt would be best suited for hospice, however if stable for transfer.   Emotional  Assessment Appearance:  Other (Comment Required Attitude/Demeanor/Rapport:  Unable to Assess Affect (typically observed):  Other Orientation:  Oriented to Self Alcohol / Substance use:  Not Applicable Psych involvement (Current and /or in the community):  No (Comment)  Discharge Needs  Concerns to be addressed:  Grief and Loss Concerns Readmission within the last 30 days:  No Current discharge risk:  Chronically ill Barriers to Discharge:  Continued Medical Work up   [employer], [place] 04/03/2017, 4:21 PM

## 2017-04-04 LAB — CULTURE, BLOOD (ROUTINE X 2)
CULTURE: NO GROWTH
CULTURE: NO GROWTH

## 2017-04-04 MED ORDER — LORAZEPAM 1 MG PO TABS: 1.0000 mg | ORAL_TABLET | ORAL | 0 refills | Status: AC | PRN

## 2017-04-04 MED ORDER — MORPHINE SULFATE (CONCENTRATE) 10 MG/0.5ML PO SOLN: 5.0000 mg | ORAL | Status: AC | PRN

## 2017-04-04 NOTE — Progress Notes (Signed)
[facility] at [place] NAME: Aaron Ellis    MR#:  [id number]  DATE OF BIRTH:  [birth year]  SUBJECTIVE:  CHIEF COMPLAINT:   Chief Complaint  Patient presents with  . Fall  . Fever   Currently drowsy received morphine for her headache     Review of Systems  Unable to perform ROS: Mental status change    VITAL SIGNS: Blood pressure 119/84, pulse 83, temperature 97.6 F (36.4 C), resp. rate 20, height 5\' 8"  (1.727 m), weight 161 lb 11.2 oz (73.3 kg), SpO2 (!) 89 %.  PHYSICAL EXAMINATION:   GENERAL:  83 y.o.-year-old patient sitting in the bed with no acute distress, relatively comfortable, currently lethargic EYES: Pupils equal, round, reactive to light and accommodation. No scleral icterus. Extraocular muscles intact.  HEENT: Head atraumatic, normocephalic. Oropharynx and nasopharynx clear. Dry oral mucosa NECK:  Supple, no jugular venous distention. No thyroid enlargement, no tenderness.  LUNGS: Normal breath sounds bilaterally, no wheezing, rales,rhonchi or crepitation. No use of accessory muscles of respiration.  CARDIOVASCULAR: S1, S2 normal. No murmurs, rubs, or gallops.  ABDOMEN: Tight. No significant tenderness was noted on palpation, nontender, some distended. Bowel sounds present. No organomegaly or mass.  EXTREMITIES: No pedal edema, cyanosis, or clubbing.  NEUROLOGIC: Not able to follow, commands PSYCHIATRIC: The patient is drowsy SKIN: No obvious rash, lesion, or ulcer.   ORDERS/RESULTS REVIEWED:   CBC  Recent Labs Lab 03/30/17 1712 03/31/17 0439 04/01/17 0443  WBC 8.8 8.9 8.8  HGB 12.1* 11.6* 11.7*  HCT 36.0* 33.6* 35.6*  PLT 136* 129* 132*  MCV 90.5 90.7 89.8  MCH 30.6 31.2 29.5  MCHC 33.8 34.4 32.8  RDW 15.7* 15.4* 15.2*  LYMPHSABS 0.7*  --   --   MONOABS 0.7  --   --   EOSABS 0.1  --   --   BASOSABS 0.0  --   --     ------------------------------------------------------------------------------------------------------------------  Chemistries   Recent Labs Lab 03/30/17 1712 03/31/17 0439 04/01/17 0443  NA 136 136 141  K 3.8 3.5 4.0  CL 102 103 107  CO2 27 27 27   GLUCOSE 117* 107* 118*  BUN 15 17 20   CREATININE 1.19 1.34* 1.64*  CALCIUM 8.8* 8.2* 8.9  AST 21  --   --   ALT 12*  --   --   ALKPHOS 79  --   --   BILITOT 0.6  --   --    ------------------------------------------------------------------------------------------------------------------ estimated creatinine clearance is 26.1 mL/min (A) (by C-G formula based on SCr of 1.64 mg/dL (H)). ------------------------------------------------------------------------------------------------------------------ No results for input(s): TSH, T4TOTAL, T3FREE, THYROIDAB in the last 72 hours.  Invalid input(s): FREET3  Cardiac Enzymes  Recent Labs Lab 03/30/17 1712  TROPONINI <0.03   ------------------------------------------------------------------------------------------------------------------ Invalid input(s): POCBNP ---------------------------------------------------------------------------------------------------------------  RADIOLOGY: No results found.  EKG:  Orders placed or performed during the hospital encounter of 03/30/17  . EKG 12-Lead  . EKG 12-Lead    ASSESSMENT AND PLAN:  Active Problems:   Pneumonia #1. Right lower lobe Pneumonia, confurt care, hospices eval Awaiting bed likely tomorrow #2. Malignant essential hypertension, comfort care measures only  #3 metabolic encephalopathy, no significant improvement supportive care  #4 acute renal insufficiency, supportive care #5 thrombocytopenia, stable no further lab draws  #6. Acute urinary retention,  continue Foley for comfort measures Management plans discussed with the patient, family and they are in agreement.   DRUG ALLERGIES:  Allergies  Allergen  Reactions  .  Corn Starch   . Corn-Containing Products     CODE STATUS:     Code Status Orders        Start     Ordered   03/30/17 2334  Do not attempt resuscitation (DNR)  Continuous    Question Answer Comment  In the event of cardiac or respiratory ARREST Do not call a "code blue"   In the event of cardiac or respiratory ARREST Do not perform Intubation, CPR, defibrillation or ACLS   In the event of cardiac or respiratory ARREST Use medication by any route, position, wound care, and other measures to relive pain and suffering. May use oxygen, suction and manual treatment of airway obstruction as needed for comfort.      03/30/17 2333    Code Status History    Date Active Date Inactive Code Status Order ID Comments User Context   04/14/2016  2:02 PM 04/18/2016  7:06 PM DNR [id number]  Fritzi Mandes, MD Inpatient    Advance Directive Documentation     Most Recent Value  Type of Advance Directive  Out of facility DNR (pink MOST or yellow form)  Pre-existing out of facility DNR order (yellow form or pink MOST form)  -  "MOST" Form in Place?  -      TOTAL TIME TAKING CARE OF THIS PATIENT: 25 minutes.   Dustin Flock M.D on 04/04/2017 at 2:08 PM  Between 7am to 6pm - Pager - [phone]  After 6pm go to [email or web address] - password [email or web address] [facility]  Office  [phone]  CC: Primary care physician; Velta Addison, Claretha Cooper, DO

## 2017-04-04 NOTE — Progress Notes (Signed)
Bed became available at the hospice home for transfer today. Writer spoke with patient's daughter [name], she remains in agreement. Hospital care team all made aware. Discharge summary completed and faxed to referral. Report called to the hospice home. EMS notified for transport. Thank you. Flo Shanks RN, BSN, [facility] [place], [facility] [phone] c

## 2017-04-04 NOTE — Progress Notes (Signed)
Discharged patient to Hospice, dentures in bag and left hearing aide the only one brought to Hospital was in

## 2017-04-04 NOTE — Discharge Summary (Signed)
[name] at [facility], [place] y.o., DOB 07-30-1919, MRN [id number]. Admission date: 03/30/2017 Discharge Date 04/04/2017 Primary MD Velta Addison, Claretha Cooper, DO Admitting Physician Demetrios Loll, MD  Admission Diagnosis  Delirium [R41.0] Fever, unspecified fever cause [R50.9] Community acquired pneumonia, unspecified laterality [J18.9]  Discharge Diagnosis   Active Problems:  Right lower lobe Pneumonia  Failure to thrive Metabolic encephalopathy due to pneumonia Acute renal insufficiency Thrombocytopenia Acute urinary retention Dementia    Hospital Course  Aaron Ellis  is a 83 y.o. male with a known history of Hypertension, Alzheimer's dementia and Benign neoplasm of prostate. The patient was sent from [facility] to the ED due to fall and fever. Patient in the emergency room was noted to have pneumonia. He was started on therapy with antibiotics. Patient continued to show much improvement. Further discussions were held with the family and he was made comfort care. He has a bed available at the hospice home.          Consults  None  Significant Tests:  See full reports for all details     Ct Head Wo Contrast  Result Date: 03/30/2017 CLINICAL DATA:  Altered mental status.  Fall EXAM: CT HEAD WITHOUT CONTRAST TECHNIQUE: Contiguous axial images were obtained from the base of the skull through the vertex without intravenous contrast. COMPARISON:  02/28/2017 FINDINGS: Brain: Moderate atrophy. Chronic white matter changes. Negative for acute infarct, hemorrhage, or mass lesion. Vascular: Negative for hyperdense vessel Skull: Negative for fracture. Sinuses/Orbits: Negative Other: None IMPRESSION: Moderate atrophy with chronic white matter changes. No acute abnormality. Electronically Signed   By: Franchot Gallo M.D.   On: 03/30/2017 19:25   Dg Chest Port 1 View  Result Date: 03/30/2017 CLINICAL DATA:  Unwitnessed fall EXAM: PORTABLE CHEST 1 VIEW COMPARISON:   Portable exam 1729 hours compared 02/25/2017 FINDINGS: Normal heart size, mediastinal contours, and pulmonary vascularity. Mild RIGHT basilar atelectasis. Lungs otherwise clear. No pleural effusion or pneumothorax. Bones demineralized. Spur formation at inferior RIGHT glenoid rim. IMPRESSION: Mild RIGHT basilar atelectasis. Electronically Signed   By: Lavonia Dana M.D.   On: 03/30/2017 17:43       Today   Subjective:   Aaron Ellis  patient currently drowsy but not in any acute distress  Objective:   Blood pressure 119/84, pulse 83, temperature 97.6 F (36.4 C), resp. rate 20, height 5\' 8"  (1.727 m), weight 161 lb 11.2 oz (73.3 kg), SpO2 (!) 89 %.  .  Intake/Output Summary (Last 24 hours) at 04/04/17 1518 Last data filed at 04/04/17 1350  Gross per 24 hour  Intake                0 ml  Output              250 ml  Net             -250 ml    Exam VITAL SIGNS: Blood pressure 119/84, pulse 83, temperature 97.6 F (36.4 C), resp. rate 20, height 5\' 8"  (1.727 m), weight 161 lb 11.2 oz (73.3 kg), SpO2 (!) 89 %.  GENERAL:  83 y.o.-year-old patient lying in the bed with no acute distress.  EYES: Pupils equal, round, reactive to light and accommodation. No scleral icterus. Extraocular muscles intact.  HEENT: Head atraumatic, normocephalic. Oropharynx and nasopharynx clear.  NECK:  Supple, no jugular venous distention. No thyroid enlargement, no tenderness.  LUNGS: Normal breath sounds bilaterally, no wheezing, rales,rhonchi or crepitation. No  use of accessory muscles of respiration.  CARDIOVASCULAR: S1, S2 normal. No murmurs, rubs, or gallops.  ABDOMEN: Soft, nontender, nondistended. Bowel sounds present. No organomegaly or mass.  EXTREMITIES: No pedal edema, cyanosis, or clubbing.  NEUROLOGIC: Cranial nerves II through XII are intact. Muscle strength 5/5 in all extremities. Sensation intact. Gait not checked.  PSYCHIATRIC: The patient is alert and oriented x 3.  SKIN: No obvious rash,  lesion, or ulcer.   Data Review     CBC w Diff: Lab Results  Component Value Date   WBC 8.8 04/01/2017   HGB 11.7 (L) 04/01/2017   HCT 35.6 (L) 04/01/2017   PLT 132 (L) 04/01/2017   LYMPHOPCT 8 03/30/2017   MONOPCT 8 03/30/2017   EOSPCT 1 03/30/2017   BASOPCT 0 03/30/2017   CMP: Lab Results  Component Value Date   NA 141 04/01/2017   K 4.0 04/01/2017   CL 107 04/01/2017   CO2 27 04/01/2017   BUN 20 04/01/2017   CREATININE 1.64 (H) 04/01/2017   PROT 6.9 03/30/2017   ALBUMIN 3.7 03/30/2017   BILITOT 0.6 03/30/2017   ALKPHOS 79 03/30/2017   AST 21 03/30/2017   ALT 12 (L) 03/30/2017  .  Micro Results Recent Results (from the past 240 hour(s))  Blood Culture (routine x 2)     Status: None   Collection Time: 03/30/17  5:12 PM  Result Value Ref Range Status   Specimen Description BLOOD L AC  Final   Special Requests BOTTLES DRAWN AEROBIC AND ANAEROBIC [place]  Final   Culture NO GROWTH 5 DAYS  Final   Report Status 04/04/2017 FINAL  Final  Blood Culture (routine x 2)     Status: None   Collection Time: 03/30/17  5:12 PM  Result Value Ref Range Status   Specimen Description BLOOD R HAND  Final   Special Requests BOTTLES DRAWN AEROBIC AND ANAEROBIC BCAV  Final   Culture NO GROWTH 5 DAYS  Final   Report Status 04/04/2017 FINAL  Final  Urine culture     Status: None   Collection Time: 03/30/17  5:12 PM  Result Value Ref Range Status   Specimen Description URINE, RANDOM  Final   Special Requests NONE  Final   Culture   Final    NO GROWTH Performed at [facility], [place] [address], [place], [place] [address]    Report Status 03/31/2017 FINAL  Final  MRSA PCR Screening     Status: None   Collection Time: 03/31/17 12:28 AM  Result Value Ref Range Status   MRSA by PCR NEGATIVE NEGATIVE Final    Comment:        The GeneXpert MRSA Assay (FDA approved for NASAL specimens only), is one component of a comprehensive MRSA colonization surveillance program. It is  not intended to diagnose MRSA infection nor to guide or monitor treatment for MRSA infections.         Code Status Orders        Start     Ordered   04/01/17 1144  Do not attempt resuscitation (DNR)  Continuous    Question Answer Comment  In the event of cardiac or respiratory ARREST Do not call a "code blue"   In the event of cardiac or respiratory ARREST Do not perform Intubation, CPR, defibrillation or ACLS   In the event of cardiac or respiratory ARREST Use medication by any route, position, wound care, and other measures to relive pain and suffering. May use  oxygen, suction and manual treatment of airway obstruction as needed for comfort.      04/01/17 1144    Code Status History    Date Active Date Inactive Code Status Order ID Comments User Context   03/30/2017 11:33 PM 04/01/2017 11:44 AM DNR [id number]  Demetrios Loll, MD Inpatient   04/14/2016  2:02 PM 04/18/2016  7:06 PM DNR [id number]  Fritzi Mandes, MD Inpatient    Advance Directive Documentation     Most Recent Value  Type of Advance Directive  Out of facility DNR (pink MOST or yellow form)  Pre-existing out of facility DNR order (yellow form or pink MOST form)  -  "MOST" Form in Place?  -            Discharge Medications   Allergies as of 04/04/2017      Reactions   Corn Starch    Corn-containing Products       Medication List    STOP taking these medications   finasteride 5 MG tablet Commonly known as:  PROSCAR   folic acid 1 MG tablet Commonly known as:  FOLVITE   guaiFENesin 100 MG/5ML Soln Commonly known as:  ROBITUSSIN   levothyroxine 75 MCG tablet Commonly known as:  SYNTHROID, LEVOTHROID   magnesium hydroxide 400 MG/5ML suspension Commonly known as:  MILK OF MAGNESIA   neomycin-bacitracin-polymyxin [phone] ointment   QUEtiapine 25 MG tablet Commonly known as:  SEROQUEL     TAKE these medications   albuterol 108 (90 Base) MCG/ACT inhaler Commonly known as:  PROVENTIL HFA;VENTOLIN  HFA Inhale 2 puffs into the lungs every 6 (six) hours as needed for wheezing or shortness of breath.   alum & mag hydroxide-simeth [phone] MG/5ML suspension Commonly known as:  MAALOX PLUS Take 30 mLs by mouth every 6 (six) hours as needed for indigestion.   escitalopram 10 MG tablet Commonly known as:  LEXAPRO Take 10 mg by mouth every morning.   loperamide 2 MG capsule Commonly known as:  IMODIUM Take 2 mg by mouth as needed for diarrhea or loose stools.   LORazepam 1 MG tablet Commonly known as:  ATIVAN Take 1 tablet (1 mg total) by mouth every 4 (four) hours as needed for anxiety.   morphine CONCENTRATE 10 MG/0.5ML Soln concentrated solution Take 0.25 mLs (5 mg total) by mouth every 2 (two) hours as needed for moderate pain (or dyspnea).          Total Time in preparing paper work, data evaluation and todays exam - 35 minutes  Dustin Flock M.D on 04/04/2017 at 3:18 PM  [facility]   Office  [phone]

## 2017-04-04 NOTE — Progress Notes (Signed)
Nutrition Brief Note  Chart reviewed and discussed with RN. Patient now transitioning to comfort care. Awaiting placement at hospice home.  Ensure Enlive and MVI have been discontinued. No further nutrition interventions warranted at this time. Please consult RD as needed.   Willey Blade, MS, RD, LDN Pager: [phone] After Hours Pager: [phone]

## 2017-04-04 NOTE — Progress Notes (Signed)
Follow up visit made to new hospice home referral. Patient's daughter [name] present. Writer initiated education regarding hospice services, philosophy and team approach to care with good understanding voiced. Patient appeared to be sleeping, he did rouse some during the visit and attempted to talk, speech garbled and difficult to understand. Patient was able to suck on a mouth swab several times, he appeared to swallow, but did cough several times afterwards. Education provided regarding aspiration precautions. Patient has required IV morphine 2 mg x 1 dose and 5 mg oral morphine x 1 dose as well as lorazepam IV 1 mg and lorazepam liquid 1 mg for management of pain and anxiety. [name] very appreciative of visit and education provided. Will continue to follow through final disposition. [name] is aware of hospice home wait list as is Hospital care team. Thank you. Flo Shanks RN, BSN, [facility] [place], hospital Liaison [phone] c

## 2017-04-04 NOTE — Clinical Social Work Note (Signed)
Pt is ready for discharge today and will go to [facility]. Pt's daughter is aware and agreeable to discharge plan. CSW prepared packet. Hopsice Liaison made arrangements for transfer. Report will be called. [facility] will provide transportation. CSW is signing off as no further needs identified.   Darden Dates, MSW, LCSW  Clinical Social Worker  [phone]

## 2017-04-18 DIAGNOSIS — J449 Chronic obstructive pulmonary disease, unspecified: Secondary | ICD-10-CM | POA: Diagnosis not present

## 2017-04-28 DEATH — deceased

## 2018-08-04 IMAGING — CT CT HEAD W/O CM
3 series · 14 of 47 positions shown, 16 images · non-contrast
Comparison: CT HEAD April 24, 2016

CLINICAL DATA: Fell at [REDACTED] today. History of
dementia, hypertension.

EXAM:
CT HEAD WITHOUT CONTRAST
TECHNIQUE: Contiguous axial images were obtained from the base of the skull
through the vertex without intravenous contrast.

[Series 2: head wo · axial · 0.47mm/px · z∈[-157,-27]mm · 8 of 32 slices shown, 10 images]
[im 3/32  brain]
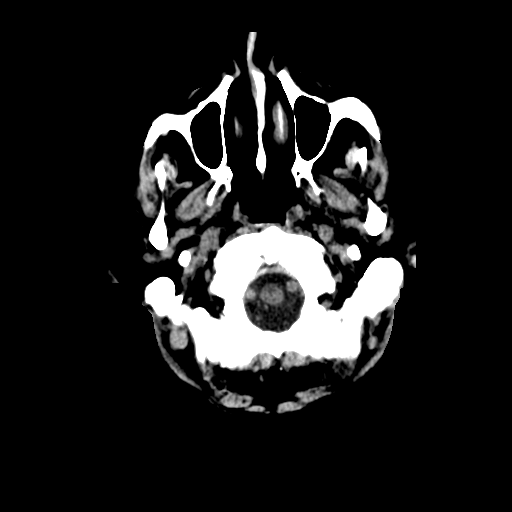
[im 3/32  bone]
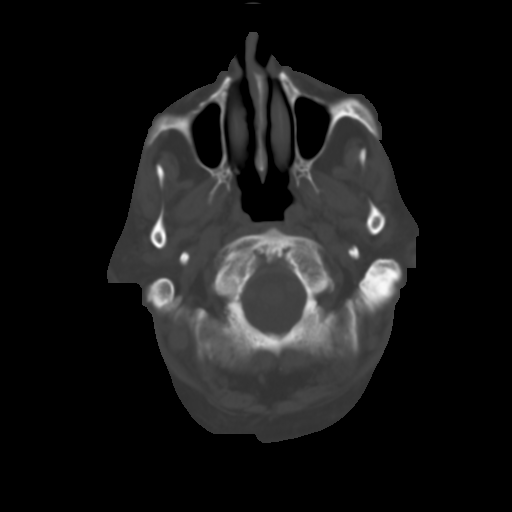
[im 7/32  brain]
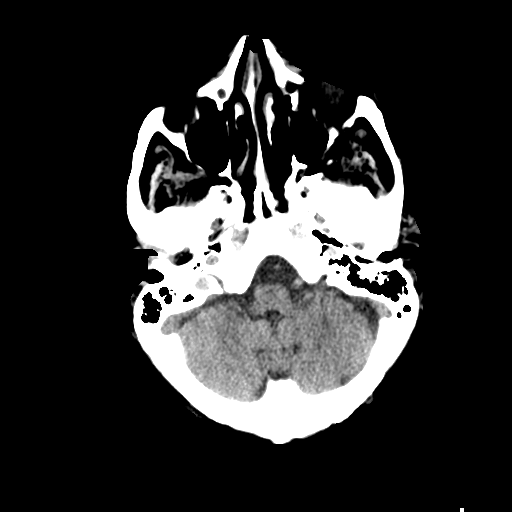
[im 10/32  brain]
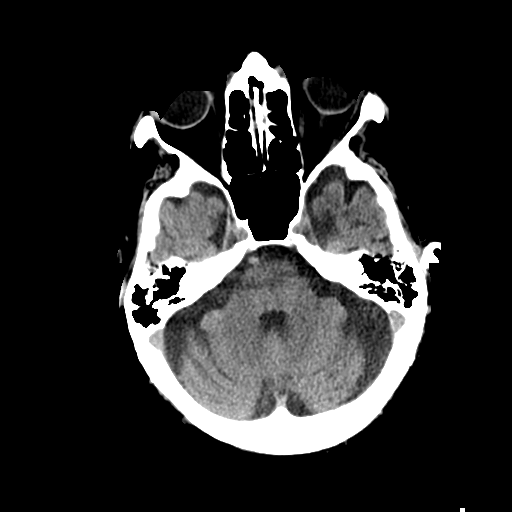
[im 14/32  brain]
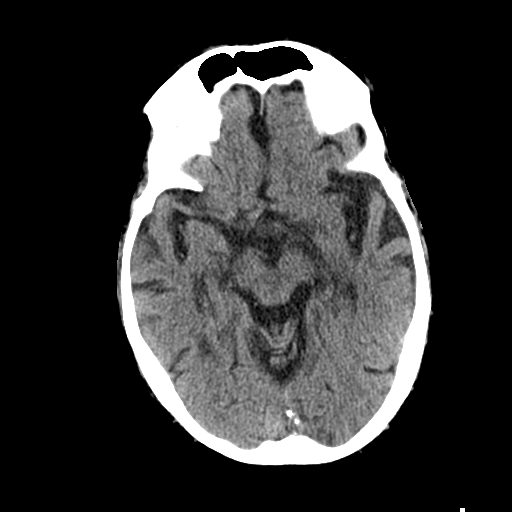
[im 18/32  brain]
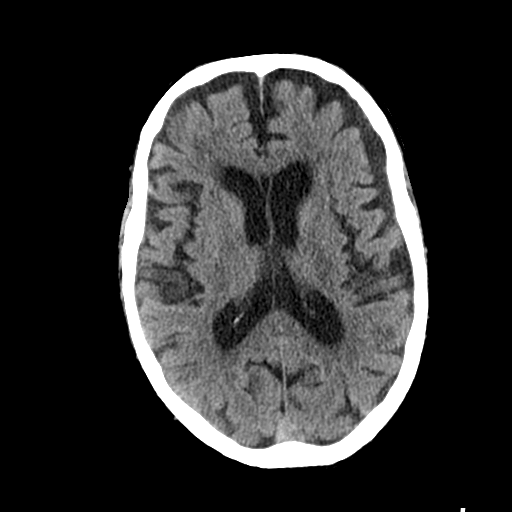
[im 18/32  bone]
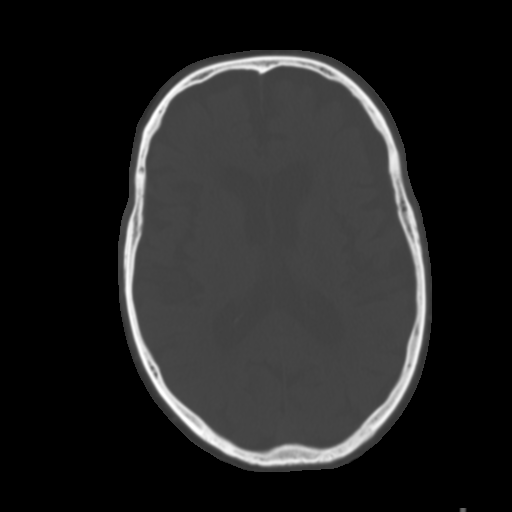
[im 22/32  brain]
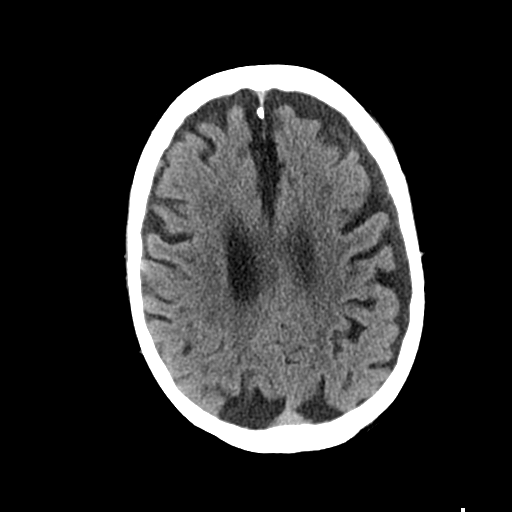
[im 25/32  brain]
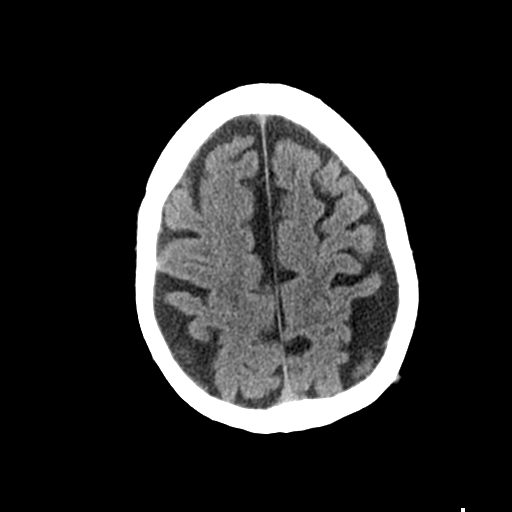
[im 29/32  brain]
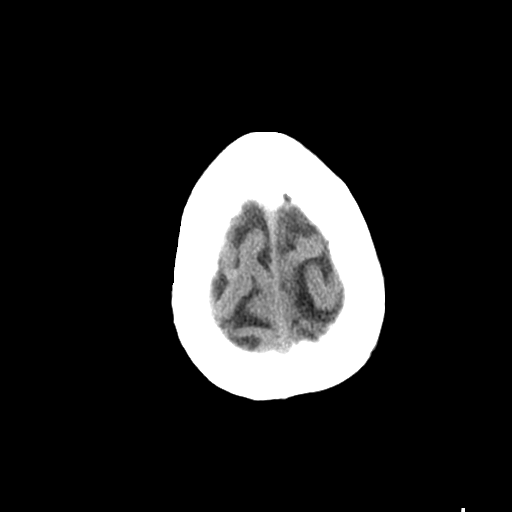

[Series 4: coronal soft tissue · coronal · 0.31mm/px · 3 of 66 slices shown]
[im 22/66  brain]
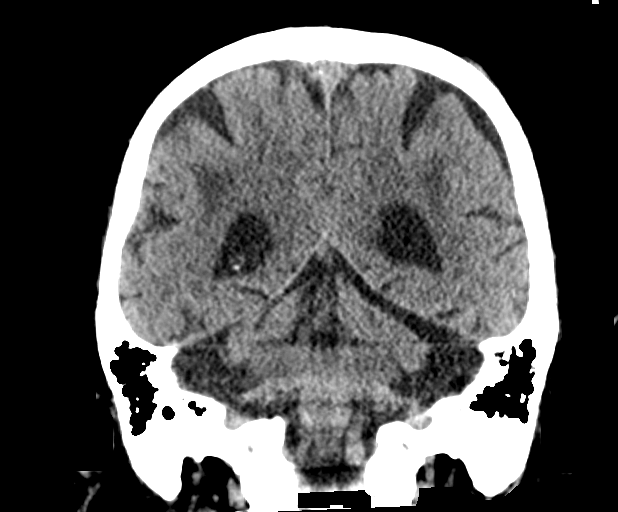
[im 29/66  brain]
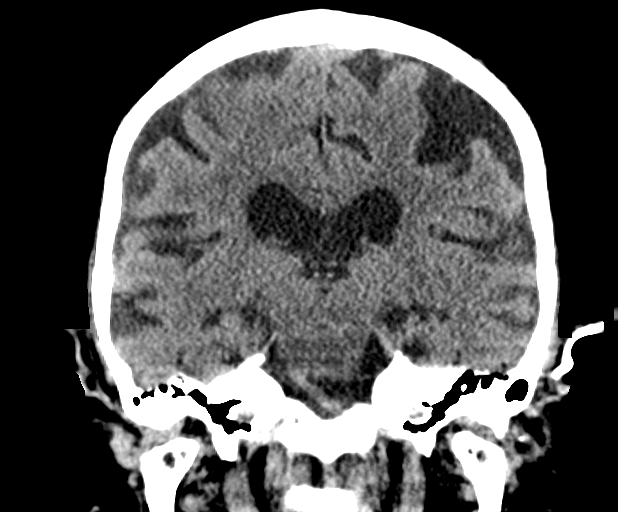
[im 37/66  brain]
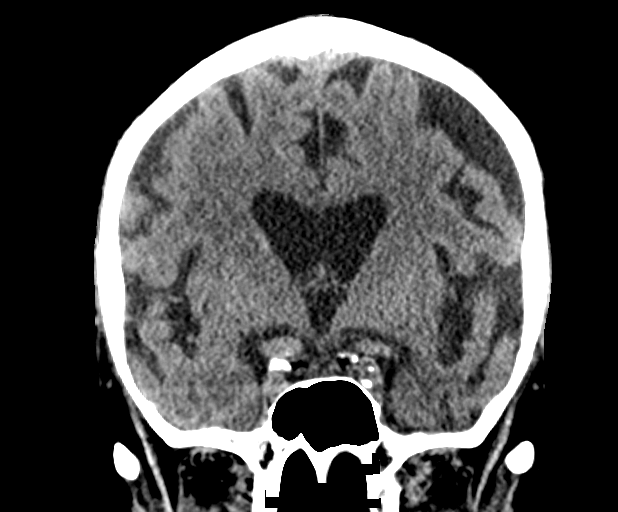

[Series 5: sagittal soft tissue · sagittal · 0.28mm/px · 3 of 51 slices shown]
[im 17/51  brain]
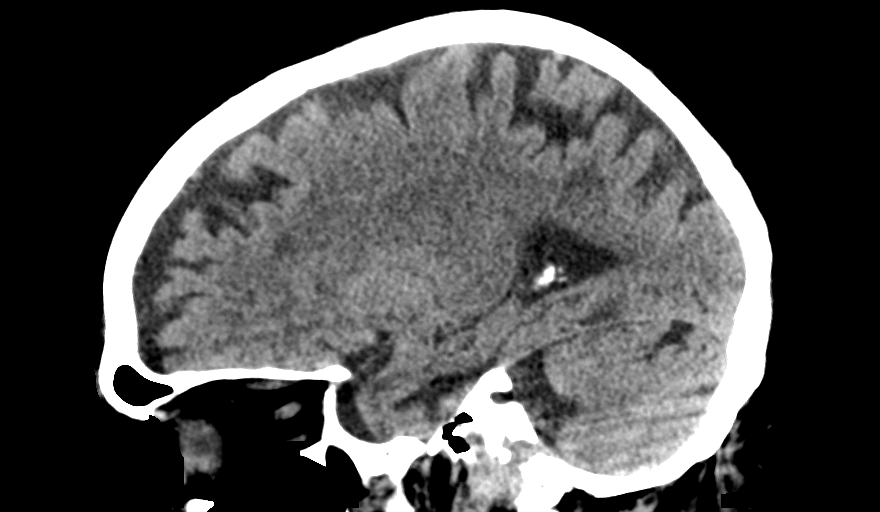
[im 26/51  brain]
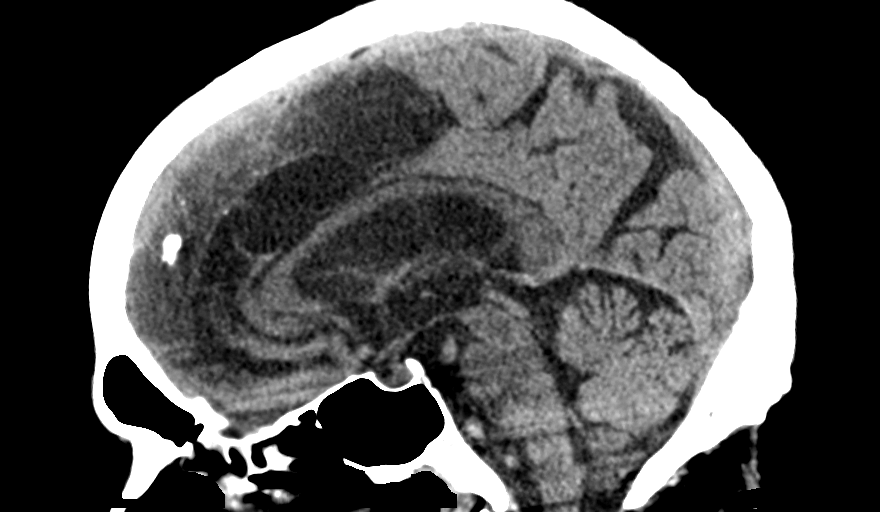
[im 34/51  brain]
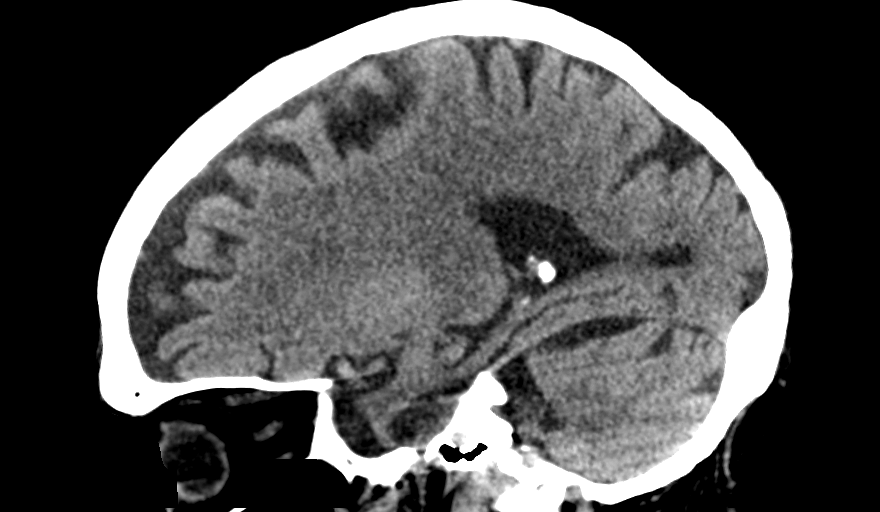

[14 of 47 positions shown; findings below may reference images not displayed]

FINDINGS: BRAIN: The ventricles and sulci are normal for age. No
intraparenchymal hemorrhage, mass effect nor midline shift. Patchy
supratentorial white matter hypodensities less than expected for
patient's age, though non-specific are most compatible with chronic
small vessel ischemic disease. No acute large vascular territory
infarcts. Similar mild asymmetrically prominent LEFT frontal
parietal extra-axial space. No significant mass effect. Basal
cisterns are patent.

VASCULAR: Minimal calcific atherosclerosis of the carotid siphons.

SKULL: No skull fracture. No significant scalp soft tissue swelling.

SINUSES/ORBITS: Trace paranasal sinus mucosal thickening. Mastoid
air cells are well aerated. Status post bilateral ocular lens
implants. The included ocular globes and orbital contents are
non-suspicious. Soft tissue within the bilateral external auditory
canals, possible tube on the LEFT.

OTHER: None.
IMPRESSION: No acute intracranial process.

Old small LEFT subdural hematoma versus hygroma without significant
mass effect; otherwise negative CT HEAD for age.

## 2018-08-08 IMAGING — DX DG ABDOMEN 1V
2 series · 2 of 2 positions shown · non-contrast
Comparison: None.

CLINICAL DATA: Distended abdomen.  Weakness, cough, and nausea.

EXAM:
ABDOMEN - 1 VIEW

[abdomen kub (1 of 2)]
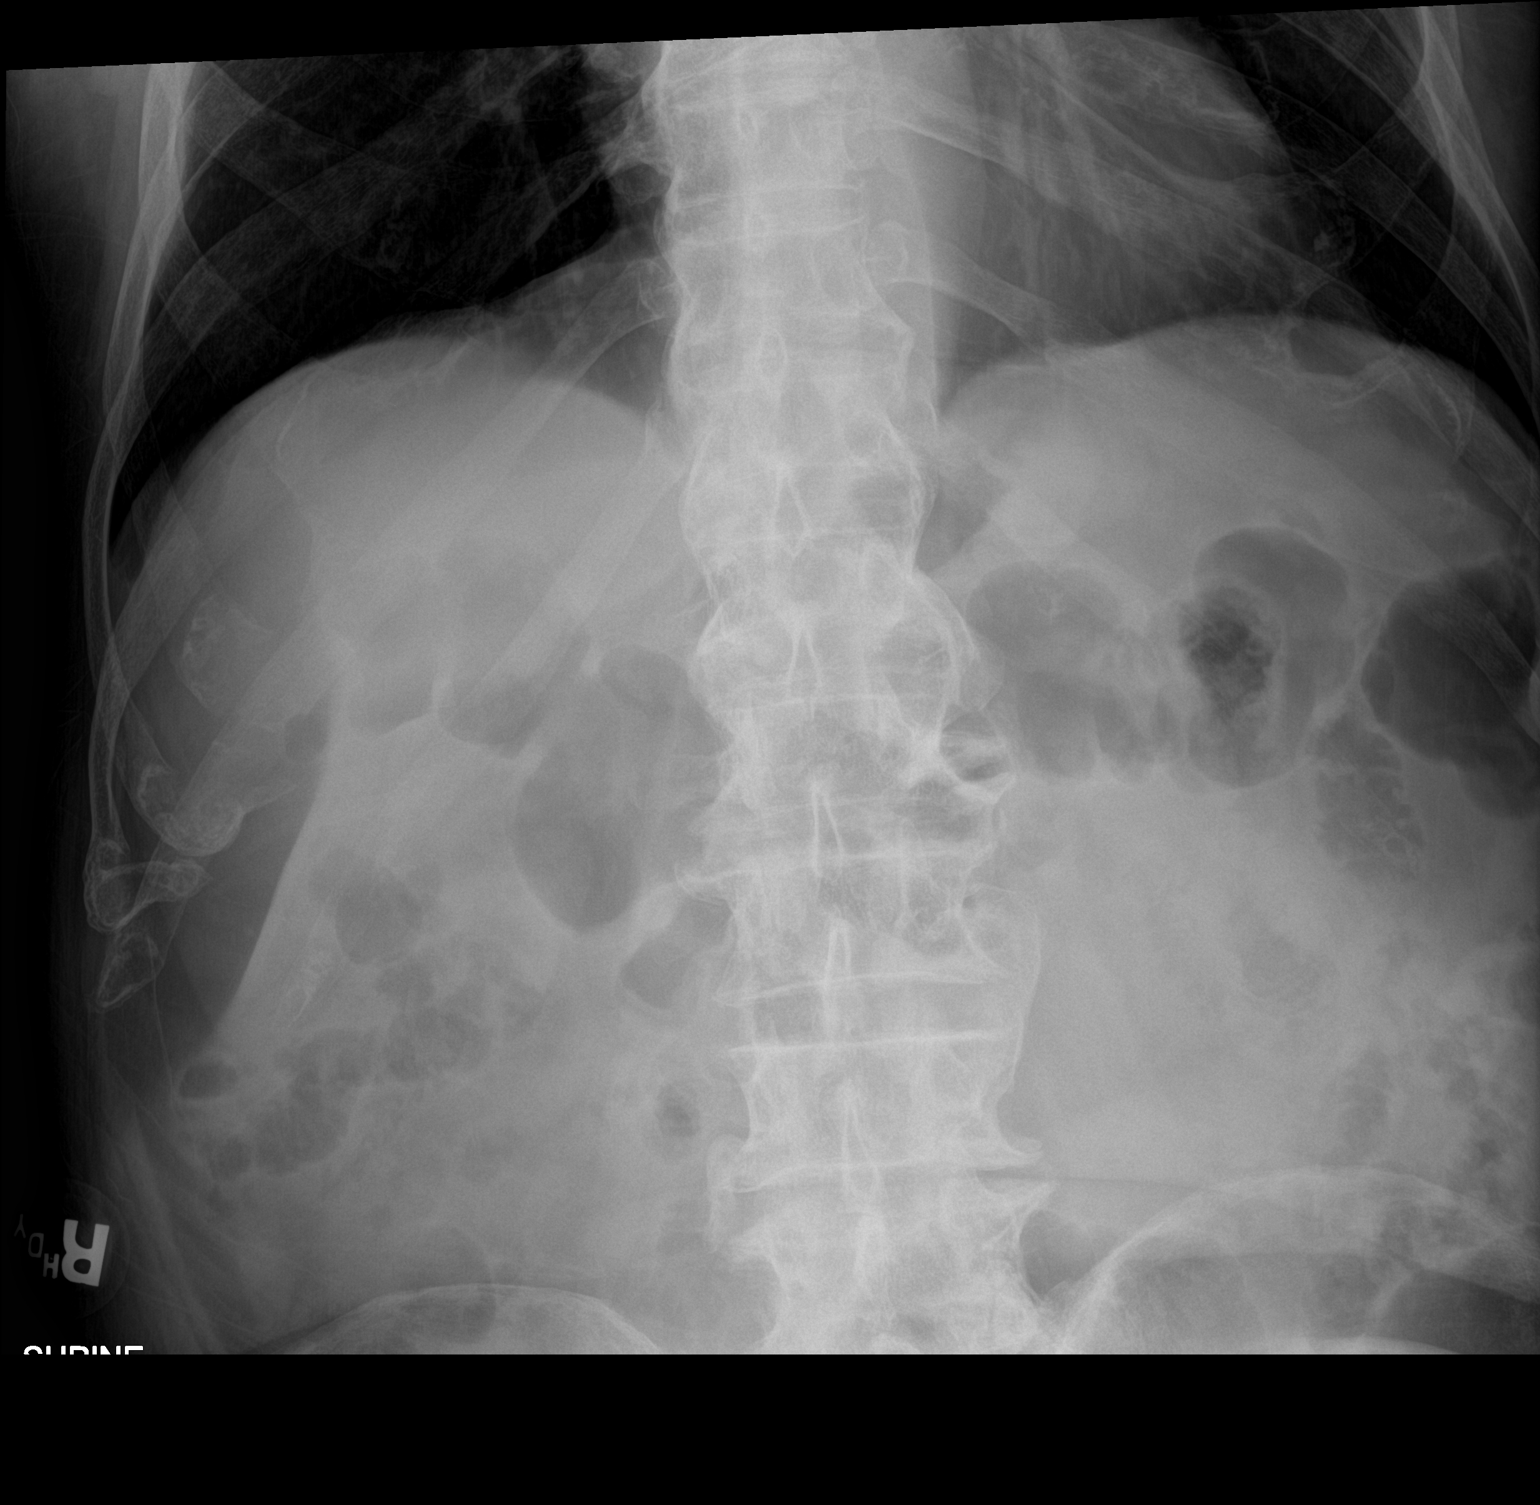

[abdomen kub (2 of 2)]
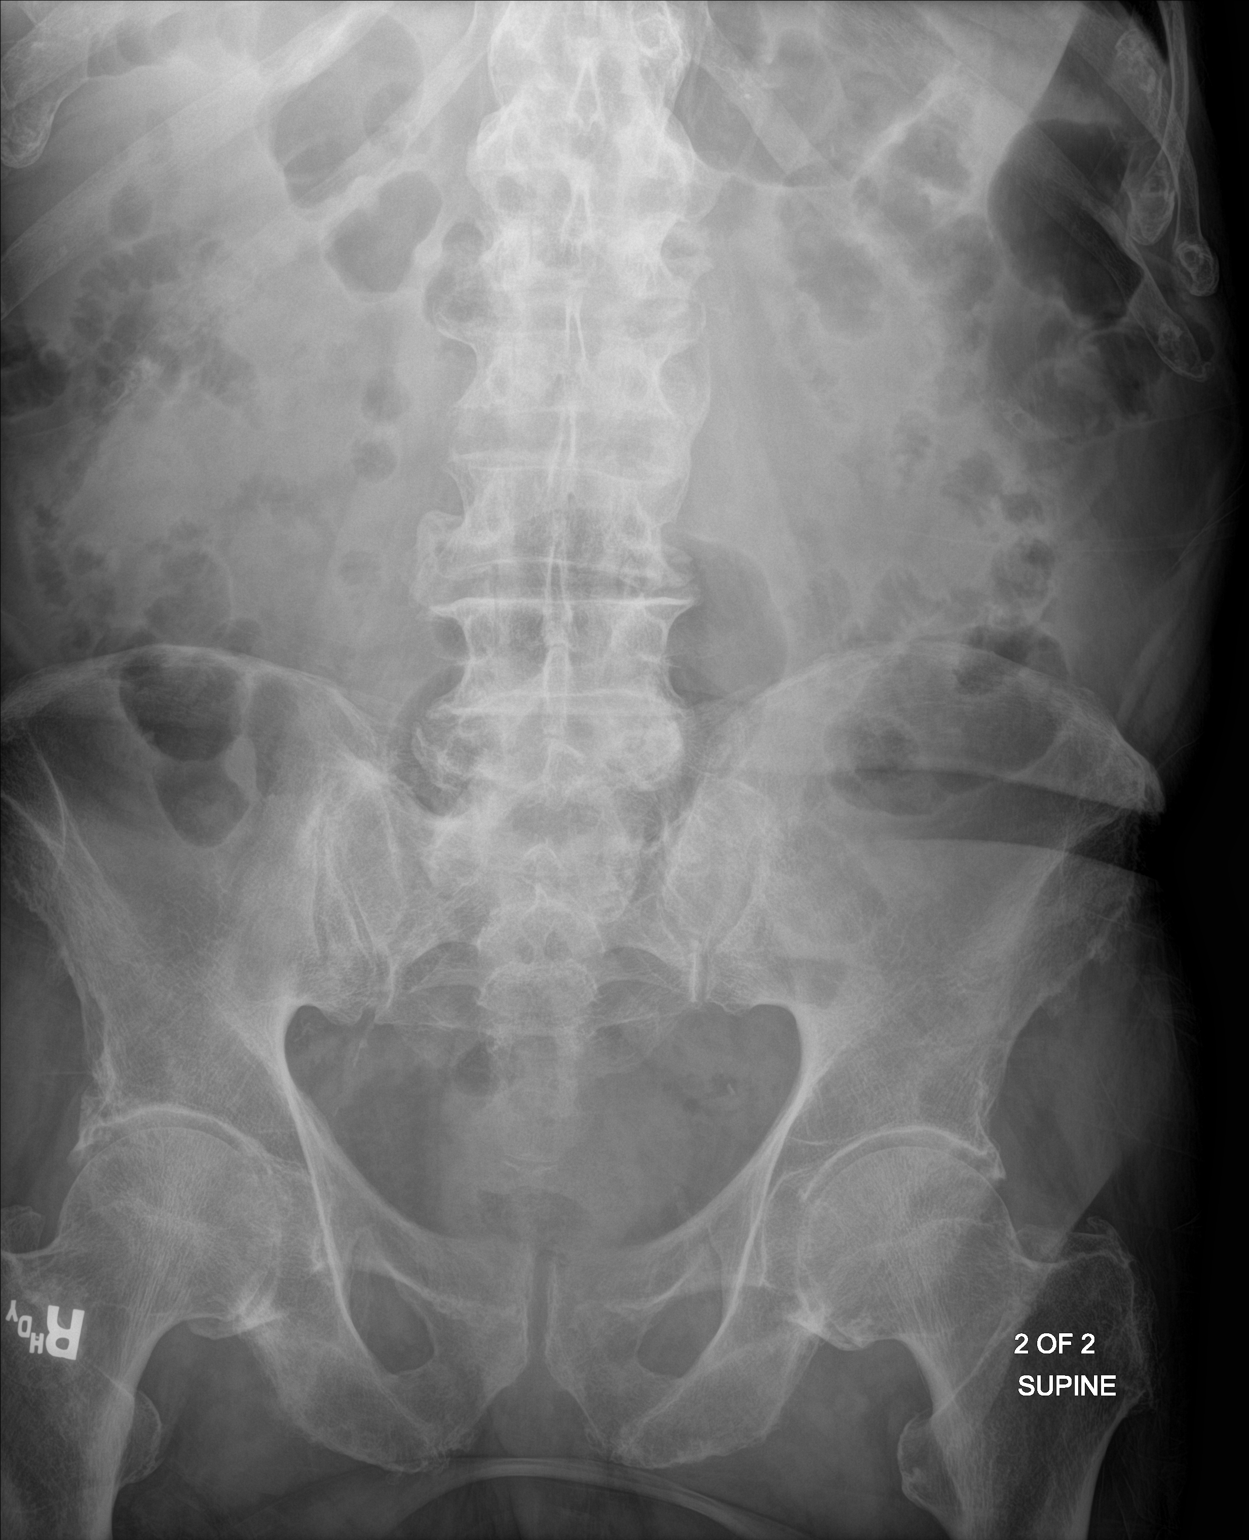

[2 of 2 positions shown; findings below may reference images not displayed]

FINDINGS: Unremarkable bowel gas pattern. Lumbar spondylosis. No significant
abnormal calcifications identified. Mild degenerative arthropathy of
both hips.
IMPRESSION: 1. Unremarkable bowel gas pattern.
2. Lumbar spondylosis.

## 2018-08-08 IMAGING — CR DG CHEST 2V
2 series · 2 of 2 positions shown · non-contrast
Comparison: None available.

CLINICAL DATA: Vomiting and diarrhea

EXAM:
CHEST  2 VIEW

[chest lat]
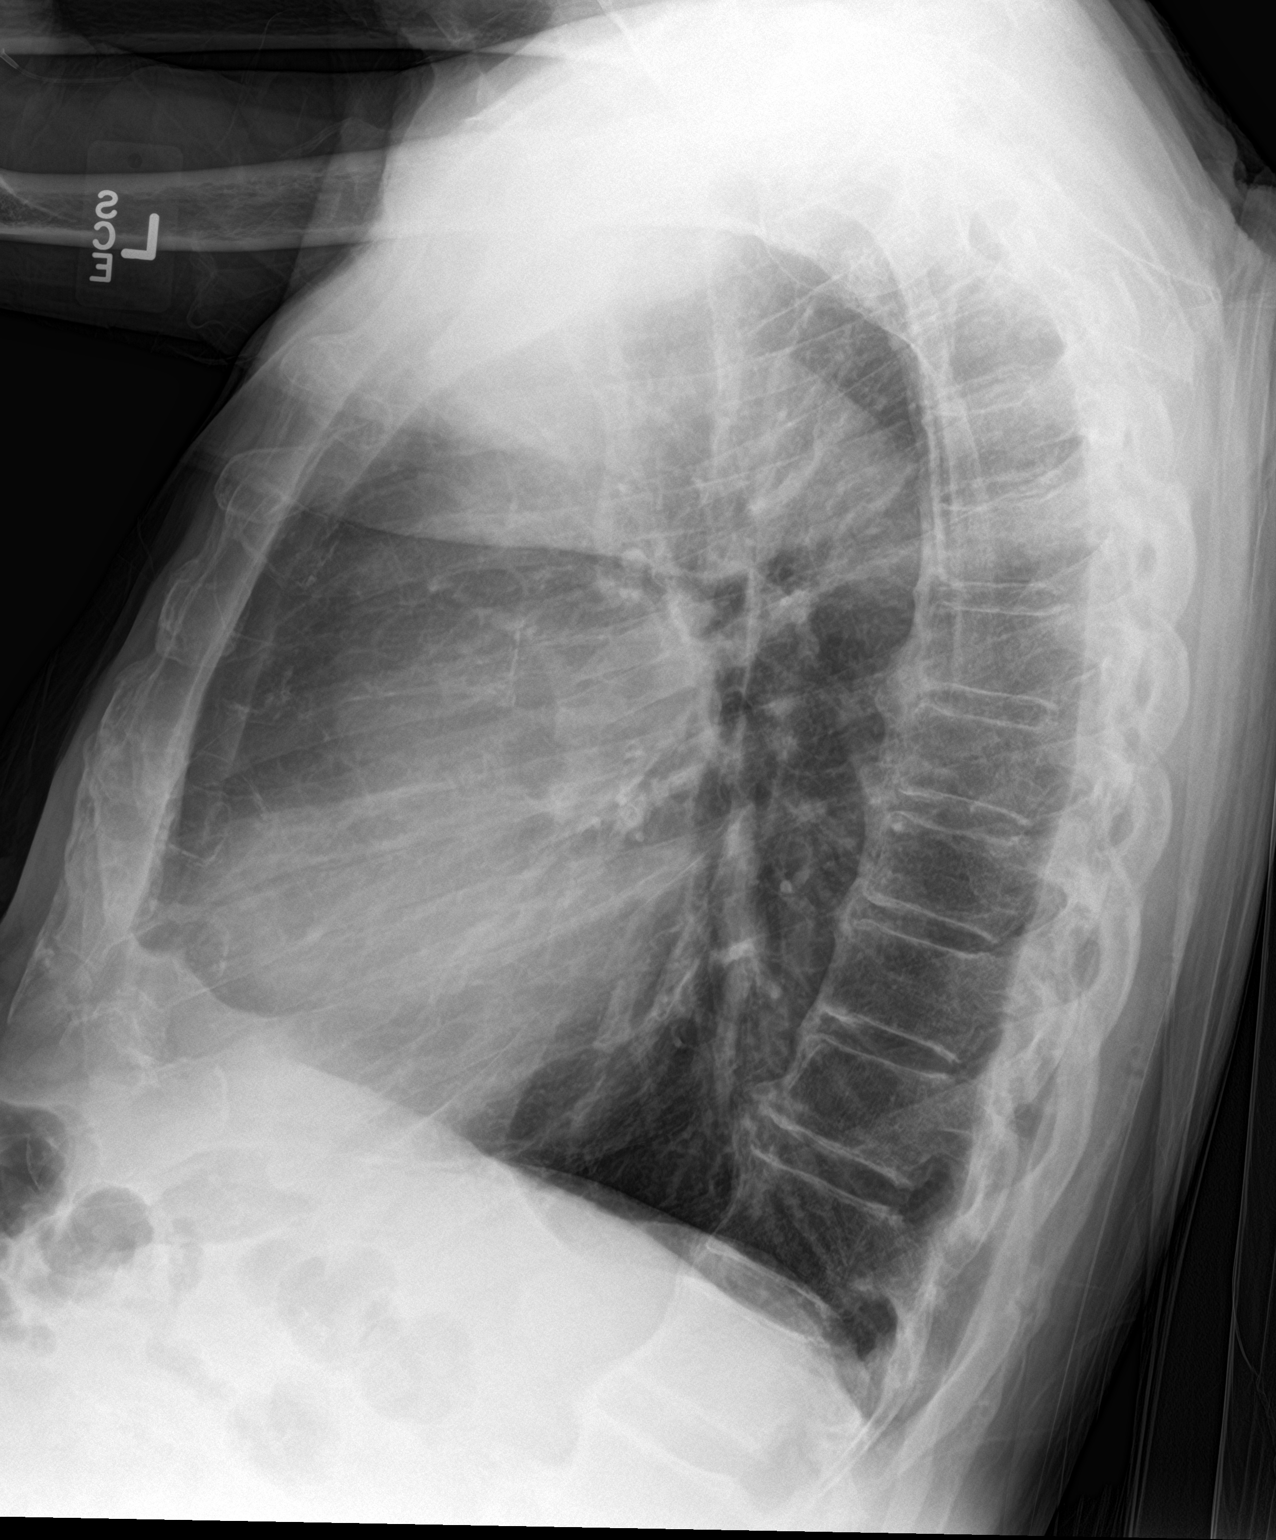

[chest ap]
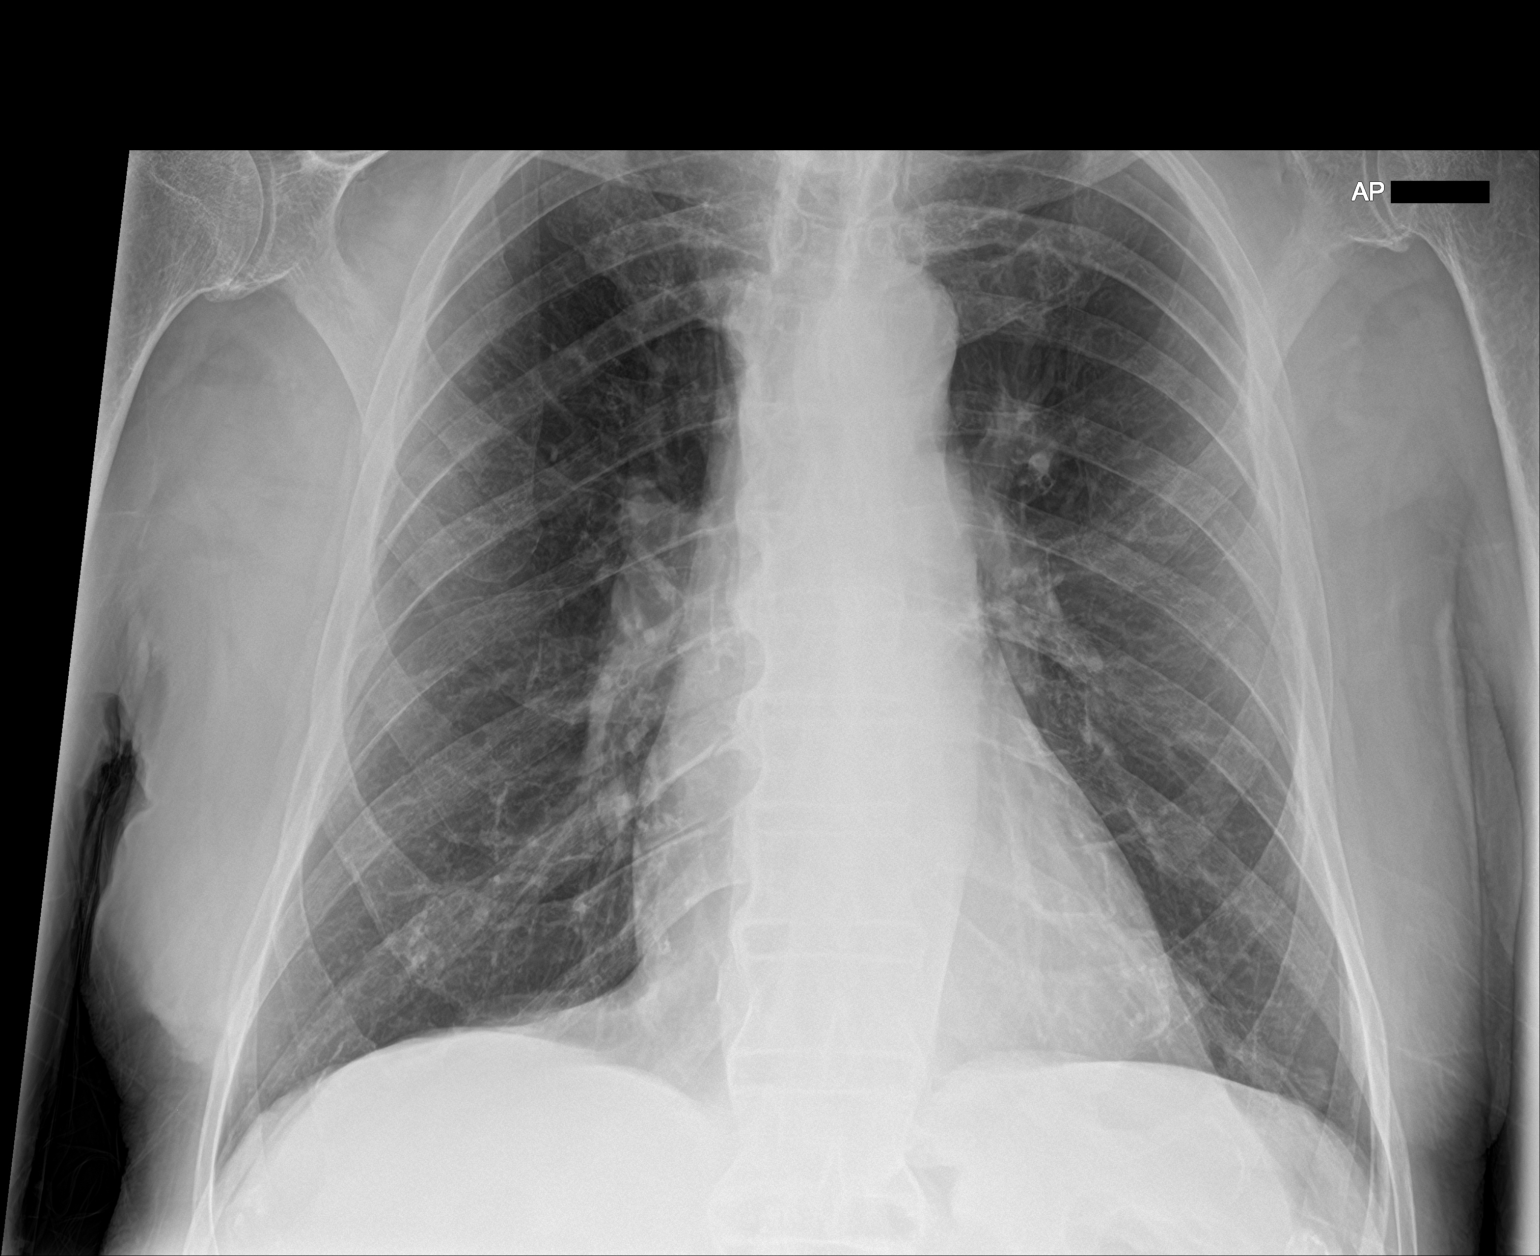

[2 of 2 positions shown; findings below may reference images not displayed]

FINDINGS: There is a left suprahilar spiculated density. Lungs otherwise
clear. Upper normal heart size. Hyperaeration. No pneumothorax or
pleural effusion.
IMPRESSION: Possible left suprahilar mass. A comparison with prior studies would
be helpful. If none are available, CT can be performed to rule out
mass.

## 2018-08-18 IMAGING — DX DG CHEST 1V PORT
1 series · 1 of 1 positions shown · non-contrast
Comparison: Chest radiograph December 09, 2016

CLINICAL DATA: Weakness, seen here 1 week ago for weakness and
diarrhea. History of hypertension and dementia.

EXAM:
PORTABLE CHEST 1 VIEW

[chest ap]
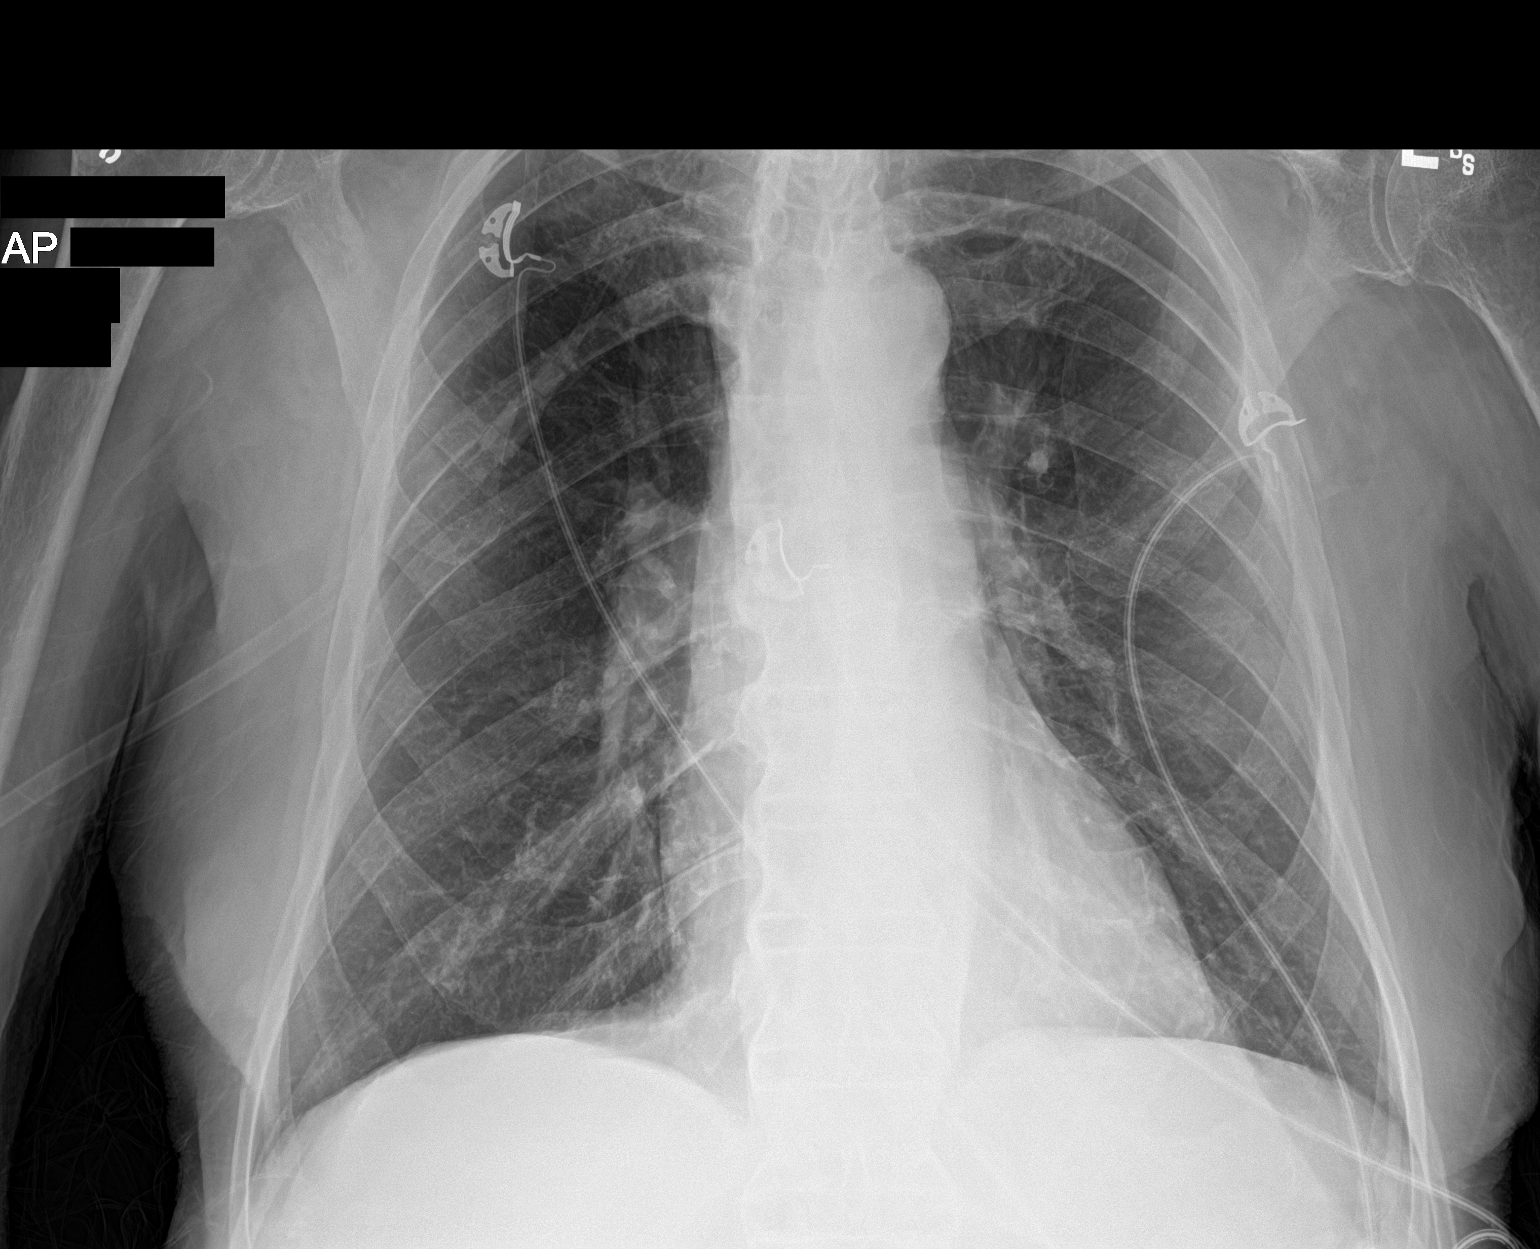

[1 of 1 positions shown; findings below may reference images not displayed]

FINDINGS: Cardiomediastinal silhouette is normal. Spiculated LEFT perihilar
density again noted. No pleural effusion or focal consolidation.
Hyperventilation. Minimal apical pleural thickening. No
pneumothorax. Osteopenia. Soft tissue planes and included osseous
structures are nonsuspicious.
IMPRESSION: COPD. Similar LEFT perihilar spiculated density concerning for mass
which would be better characterized on contrast-enhanced CT chest as
clinically indicated.

requested. If submitted, a comparison will be performed and an
addendum made to this report.
